# Patient Record
Sex: Female | Born: 1955 | Race: White | Hispanic: No | Smoking: Never smoker
Health system: Southern US, Community
[De-identification: ages and names within clinical notes are randomized; demographics above are authoritative.]

## PROBLEM LIST (undated history)

## (undated) DIAGNOSIS — G43909 Migraine, unspecified, not intractable, without status migrainosus: Secondary | ICD-10-CM

## (undated) DIAGNOSIS — R7303 Prediabetes: Secondary | ICD-10-CM

## (undated) DIAGNOSIS — M5430 Sciatica, unspecified side: Secondary | ICD-10-CM

## (undated) DIAGNOSIS — R569 Unspecified convulsions: Secondary | ICD-10-CM

## (undated) DIAGNOSIS — T7840XA Allergy, unspecified, initial encounter: Secondary | ICD-10-CM

## (undated) DIAGNOSIS — I10 Essential (primary) hypertension: Secondary | ICD-10-CM

## (undated) DIAGNOSIS — M48061 Spinal stenosis, lumbar region without neurogenic claudication: Secondary | ICD-10-CM

## (undated) DIAGNOSIS — F418 Other specified anxiety disorders: Secondary | ICD-10-CM

## (undated) DIAGNOSIS — M5440 Lumbago with sciatica, unspecified side: Secondary | ICD-10-CM

## (undated) DIAGNOSIS — G249 Dystonia, unspecified: Secondary | ICD-10-CM

## (undated) DIAGNOSIS — K259 Gastric ulcer, unspecified as acute or chronic, without hemorrhage or perforation: Secondary | ICD-10-CM

## (undated) HISTORY — PX: EYE SURGERY: SHX253

## (undated) HISTORY — DX: Sciatica, unspecified side: M54.30

## (undated) HISTORY — DX: Gastric ulcer, unspecified as acute or chronic, without hemorrhage or perforation: K25.9

## (undated) HISTORY — DX: Allergy, unspecified, initial encounter: T78.40XA

## (undated) HISTORY — PX: BREAST EXCISIONAL BIOPSY: SUR124

## (undated) HISTORY — DX: Other specified anxiety disorders: F41.8

## (undated) HISTORY — PX: OTHER SURGICAL HISTORY: SHX169

## (undated) HISTORY — DX: Unspecified convulsions: R56.9

## (undated) HISTORY — DX: Migraine, unspecified, not intractable, without status migrainosus: G43.909

## (undated) HISTORY — DX: Dystonia, unspecified: G24.9

---
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
## 1991-10-14 HISTORY — PX: BREAST BIOPSY: SHX20

## 2007-09-22 ENCOUNTER — Ambulatory Visit: Payer: Self-pay | Admitting: Neurology

## 2008-09-11 ENCOUNTER — Ambulatory Visit: Payer: Self-pay | Admitting: Unknown Physician Specialty

## 2008-09-14 ENCOUNTER — Ambulatory Visit: Payer: Self-pay | Admitting: Unknown Physician Specialty

## 2010-09-30 ENCOUNTER — Ambulatory Visit: Payer: Self-pay | Admitting: Unknown Physician Specialty

## 2013-03-21 ENCOUNTER — Other Ambulatory Visit: Payer: Self-pay

## 2013-03-21 NOTE — Telephone Encounter (Signed)
Forwarding to provider for approval.

## 2013-03-21 NOTE — Telephone Encounter (Signed)
Called pt. She is requesting Clonazepam refill. Only has 1 more for tomorrow.

## 2013-03-23 MED ORDER — CLONAZEPAM 0.5 MG PO TABS: 0.5000 mg | ORAL_TABLET | Freq: Two times a day (BID) | ORAL | Status: DC

## 2013-09-21 ENCOUNTER — Ambulatory Visit (INDEPENDENT_AMBULATORY_CARE_PROVIDER_SITE_OTHER): Payer: Commercial Managed Care - PPO | Admitting: Nurse Practitioner

## 2013-09-21 ENCOUNTER — Encounter: Payer: Self-pay | Admitting: Nurse Practitioner

## 2013-09-21 ENCOUNTER — Encounter (INDEPENDENT_AMBULATORY_CARE_PROVIDER_SITE_OTHER): Payer: Self-pay

## 2013-09-21 VITALS — BP 121/72 | HR 65 | Ht 66.25 in | Wt 233.0 lb

## 2013-09-21 DIAGNOSIS — R259 Unspecified abnormal involuntary movements: Secondary | ICD-10-CM

## 2013-09-21 MED ORDER — CLONAZEPAM 0.5 MG PO TABS: 0.5000 mg | ORAL_TABLET | Freq: Two times a day (BID) | ORAL | Status: DC

## 2013-09-21 NOTE — Progress Notes (Signed)
[facility]  PATIENT: Kiara Haas. Gerstenberger DOB: 1956-08-22   REASON FOR VISIT: follow up for tremor   HISTORY OF PRESENT ILLNESS: Kiara Haas, 57 year old female returns for followup. She was last seen 09/21/2012. 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 She denies any difficulty performing her job, writing or performing other tasks. She is on an antidepressant by her primary care. 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 When initially diagnosed her symptoms were variable, sometimes mild and sometimes interfering with her ability to write and perform  fine function movements She has a remote history of epilepsy, she has had no seizure activity. She denies any difficulty writing or performing other tasks now, she has had no problems with her job. Hx of  depression and been placed on any anti-depressant in recent years. No new neurologic complaints.   REVIEW OF SYSTEMS: Full 14 system review of systems performed and notable only for those listed, all others are neg:  Constitutional: N/A  Cardiovascular: N/A  Ear/Nose/Throat: N/A  Skin: N/A  Eyes: N/A  Respiratory: N/A  Gastroitestinal: N/A  Hematology/Lymphatic: N/A  Endocrine: N/A Musculoskeletal:N/A  Allergy/Immunology: N/A  Neurological: N/A Psychiatric: N/A   ALLERGIES: Allergies  Allergen Reactions  . Cephalexin Other (See Comments)    Blisters in the mouth  . Paxil [Paroxetine Hcl]     Blisters in mouth and over body    HOME MEDICATIONS: Outpatient Prescriptions Prior to Visit  Medication Sig Dispense Refill  . clonazePAM (KLONOPIN) 0.5 MG tablet Take 1 tablet (0.5 mg total) by mouth 2 (two) times daily.  60 tablet  5   No facility-administered medications prior to visit.    PAST MEDICAL HISTORY: Past Medical History   Diagnosis Date  . Depression with anxiety   . Migraine   . Dystonia     hand    PAST SURGICAL HISTORY: Past Surgical History  Procedure Laterality Date  . Eye surgery      times 2 age 66 and 45 lazy eye  . Polyps burned      FAMILY HISTORY: Family History  Problem Relation Age of Onset  . Breast cancer Sister   . Pancreatic cancer Maternal Aunt   . Pancreatic cancer Paternal Aunt   . Pancreatic cancer Paternal Grandmother   . Heart disease Brother   . Diabetes Mother   . Diabetes Sister   . Diabetes Brother   . COPD Mother     SOCIAL HISTORY: History   Social History  . Marital Status: Divorced    Spouse Name: N/A    Number of Children: 0  . Years of Education: 12+   Occupational History  . Not on file.   Social History Main Topics  . Smoking status: Never Smoker   . Smokeless tobacco: Never Used  . Alcohol Use: No  . Drug Use: No  . Sexual Activity: Not on file   Other Topics Concern  . Adm asst for Kiara Haas retirement   Social History Narrative   Patient lives at home alone with 2 cats.    Patient has no children.    Patient is single. /separated   Patient has 2 years of college.      PHYSICAL EXAM  Filed Vitals:   09/21/13 1449  BP: 121/72  Pulse: 65  Height:  5' 6.25" (1.683 m)  Weight: 233 lb (105.688 kg)   Body mass index is 37.31 kg/(m^2).  Generalized: Well developed, in no acute distress     Neurological examination   Mentation: Alert oriented to time, place, history taking. Follows all commands speech and language fluent  Cranial nerve II-XII: Pupils were equal round reactive to light extraocular movements were full, visual field were full on confrontational test. Facial sensation and strength were normal. hearing was intact to finger rubbing bilaterally. Uvula tongue midline. head turning and shoulder shrug were normal and symmetric.Tongue protrusion into cheek strength was normal. Motor: normal bulk and tone, full strength in  the BUE, BLE, fine finger movements normal, no pronator drift. No focal weakness Sensory: normal and symmetric to light touch, pinprick, and  vibration  Coordination: finger-nose-finger, heel-to-shin bilaterally, no dysmetria Reflexes: Brachioradialis 2/2, biceps 2/2, triceps 2/2, patellar 2/2, Achilles 2/2, plantar responses were flexor bilaterally. Gait and Station: Rising up from seated position without assistance, normal stance,  moderate stride, good arm swing, smooth turning, able to perform tiptoe, and heel walking without difficulty. Tandem gait is steady  DIAGNOSTIC DATA (LABS, IMAGING, TESTING) None to review   ASSESSMENT AND PLAN  57 y.o. year old female  has a past medical history of Depression with anxiety; Migraine; and Dystonia. here to followup.   Continue Clonazepam at current dose RX to pt F/U yearly Nilda Riggs, [facility], [facility], APRN  [facility] [address] [place], [place] [address] [phone]

## 2013-09-21 NOTE — Patient Instructions (Signed)
Continue Clonazepam at current dose RX to pt F/U yearly

## 2013-09-22 NOTE — Progress Notes (Signed)
I reviewed note and agree with plan.   Saida Lonon R. Omaree Fuqua, MD  Certified in Neurology, Neurophysiology and Neuroimaging  [facility] [address], [address] [place], [place] [address] [phone]   

## 2013-10-19 ENCOUNTER — Other Ambulatory Visit: Payer: Self-pay | Admitting: Nurse Practitioner

## 2014-03-15 ENCOUNTER — Other Ambulatory Visit: Payer: Self-pay | Admitting: Nurse Practitioner

## 2014-03-16 ENCOUNTER — Other Ambulatory Visit: Payer: Self-pay

## 2014-03-16 MED ORDER — CLONAZEPAM 0.5 MG PO TABS: 0.5000 mg | ORAL_TABLET | Freq: Two times a day (BID) | ORAL | Status: DC

## 2014-03-16 NOTE — Telephone Encounter (Signed)
Rx signed and faxed.

## 2014-03-16 NOTE — Telephone Encounter (Signed)
Dr Penumalli is out of the office.  Forwarding request to [facility] for approval  

## 2014-09-12 LAB — HM MAMMOGRAPHY

## 2014-09-21 ENCOUNTER — Encounter: Payer: Self-pay | Admitting: Nurse Practitioner

## 2014-09-21 ENCOUNTER — Ambulatory Visit (INDEPENDENT_AMBULATORY_CARE_PROVIDER_SITE_OTHER): Payer: Commercial Managed Care - PPO | Admitting: Nurse Practitioner

## 2014-09-21 VITALS — BP 130/79 | HR 61 | Ht 67.25 in | Wt 239.0 lb

## 2014-09-21 DIAGNOSIS — R259 Unspecified abnormal involuntary movements: Secondary | ICD-10-CM

## 2014-09-21 DIAGNOSIS — G249 Dystonia, unspecified: Secondary | ICD-10-CM | POA: Insufficient documentation

## 2014-09-21 MED ORDER — CLONAZEPAM 0.5 MG PO TABS: 0.5000 mg | ORAL_TABLET | Freq: Two times a day (BID) | ORAL | Status: DC

## 2014-09-21 NOTE — Progress Notes (Signed)
[facility]  PATIENT: Kiara Haas DOB: [birth year]   REASON FOR VISIT: Follow-up for dystonia of the right upper extremity   HISTORY OF PRESENT ILLNESS:Kiara Haas, 58 year old female returns for followup. She was last seen 09/21/2013. 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 She denies any difficulty performing her job, writing or performing other tasks. She is on an antidepressant by her primary care. She has no new neurologic complaints. She returns for reevaluation. She needs refills on medication.   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 When initially diagnosed her symptoms were variable, sometimes mild and sometimes interfering with her ability to write and perform fine function movements She has a remote history of epilepsy, she has had no seizure activity. She denies any difficulty writing or performing other tasks now, she has had no problems with her job. Hx of depression and been placed on any anti-depressant in recent years. No new neurologic complaints.   REVIEW OF SYSTEMS: Full 14 system review of systems performed and notable only for those listed, all others are neg:  Constitutional: N/A  Cardiovascular: N/A  Ear/Nose/Throat: N/A  Skin: N/A  Eyes: N/A  Respiratory: N/A  Gastroitestinal: N/A  Hematology/Lymphatic: N/A  Endocrine: N/A Musculoskeletal:N/A  Allergy/Immunology: N/A  Neurological: N/A Psychiatric: N/A Sleep : NA   ALLERGIES: Allergies  Allergen Reactions  . Cephalexin Other (See Comments)    Blisters in the mouth  . Paxil [Paroxetine Hcl]     Blisters in mouth and over body    HOME MEDICATIONS: Outpatient Prescriptions Prior to Visit  Medication Sig Dispense Refill  . Calcium Carbonate (CALTRATE 600 PO) Take 600 mg by mouth daily.    . clonazePAM (KLONOPIN) 0.5 MG tablet Take  1 tablet (0.5 mg total) by mouth 2 (two) times daily. 60 tablet 5  . sertraline (ZOLOFT) 50 MG tablet Take 50 mg by mouth daily.     No facility-administered medications prior to visit.    PAST MEDICAL HISTORY: Past Medical History  Diagnosis Date  . Depression with anxiety   . Migraine   . Dystonia     hand    PAST SURGICAL HISTORY: Past Surgical History  Procedure Laterality Date  . Eye surgery      times 2 age 89 and [age] lazy eye  . Polyps burned      FAMILY HISTORY: Family History  Problem Relation Age of Onset  . Breast cancer Sister   . Pancreatic cancer Maternal Aunt   . Pancreatic cancer Paternal Aunt   . Pancreatic cancer Paternal Grandmother   . Heart disease Brother   . Diabetes Mother   . Diabetes Sister   . Diabetes Brother   . COPD Mother     SOCIAL HISTORY: History   Social History  . Marital Status: Divorced    Spouse Name: N/A    Number of Children: 0  . Years of Education: 12+   Occupational History  . Not on file.   Social History Main Topics  . Smoking status: Never Smoker   . Smokeless tobacco: Never Used  . Alcohol Use: No  . Drug Use: No  . Sexual Activity: Not on file   Other Topics Concern  . Not on file   Social History Narrative   Patient lives at home alone with 2 cats.    Patient has no  children.    Patient is single.    Patient has 2 years of college.      PHYSICAL EXAM  Filed Vitals:   09/21/14 1446  BP: 130/79  Pulse: 61  Height: 5' 7.25" (1.708 m)  Weight: 239 lb (108.41 kg)   Body mass index is 37.16 kg/(m^2). Generalized: Well developed, in no acute distress    Neurological examination   Mentation: Alert oriented to time, place, history taking. Follows all commands speech and language fluent  Cranial nerve II-XII: Pupils were equal round reactive to light extraocular movements were full, visual field were full on confrontational test. Facial sensation and strength were normal. hearing was intact to  finger rubbing bilaterally. Uvula tongue midline. head turning and shoulder shrug were normal and symmetric.Tongue protrusion into cheek strength was normal. Motor: normal bulk and tone, full strength in the BUE, BLE, fine finger movements normal, no pronator drift. No focal weakness Coordination: finger-nose-finger, heel-to-shin bilaterally, no dysmetria Reflexes: Brachioradialis 2/2, biceps 2/2, triceps 2/2, patellar 2/2, Achilles 2/2, plantar responses were flexor bilaterally. Gait and Station: Rising up from seated position without assistance, normal stance, moderate stride, good arm swing, smooth turning, able to perform tiptoe, and heel walking without difficulty. Tandem gait is steady   DIAGNOSTIC DATA (LABS, IMAGING, TESTING) ASSESSMENT AND PLAN  58 y.o. year old female  has a past medical history of Depression with anxiety; Migraine; and Dystonia. here to follow-up for her right upper extremity dystonia.  Continue clonazepam at current dose Follow-up yearly and when necessary Kiara Haas, [facility], [facility], APRN  [facility] [address], [address] [place], [place] [address] [phone]

## 2014-09-21 NOTE — Patient Instructions (Signed)
Continue clonazepam at current dose  Follow-up yearly and when necessary  

## 2014-09-21 NOTE — Progress Notes (Signed)
I reviewed note and agree with plan.   Suanne MarkerVIKRAM R. PENUMALLI, MD 09/21/2014, 6:40 PM Certified in Neurology, Neurophysiology and Neuroimaging  [facility] [address] [place], [place] [address] [phone]

## 2014-10-02 LAB — HM MAMMOGRAPHY

## 2014-10-26 ENCOUNTER — Emergency Department: Payer: Self-pay | Admitting: Emergency Medicine

## 2014-12-05 ENCOUNTER — Encounter: Payer: Self-pay | Admitting: Family Medicine

## 2014-12-05 ENCOUNTER — Ambulatory Visit (INDEPENDENT_AMBULATORY_CARE_PROVIDER_SITE_OTHER): Payer: Commercial Managed Care - PPO | Admitting: Family Medicine

## 2014-12-05 VITALS — BP 132/78 | HR 60 | Temp 97.9°F | Ht 66.0 in | Wt 237.5 lb

## 2014-12-05 DIAGNOSIS — R259 Unspecified abnormal involuntary movements: Secondary | ICD-10-CM

## 2014-12-05 DIAGNOSIS — F329 Major depressive disorder, single episode, unspecified: Secondary | ICD-10-CM

## 2014-12-05 DIAGNOSIS — F419 Anxiety disorder, unspecified: Secondary | ICD-10-CM | POA: Insufficient documentation

## 2014-12-05 DIAGNOSIS — F4321 Adjustment disorder with depressed mood: Secondary | ICD-10-CM

## 2014-12-05 DIAGNOSIS — F32A Depression, unspecified: Secondary | ICD-10-CM | POA: Insufficient documentation

## 2014-12-05 DIAGNOSIS — F418 Other specified anxiety disorders: Secondary | ICD-10-CM

## 2014-12-05 DIAGNOSIS — M5431 Sciatica, right side: Secondary | ICD-10-CM | POA: Insufficient documentation

## 2014-12-05 DIAGNOSIS — G249 Dystonia, unspecified: Secondary | ICD-10-CM

## 2014-12-05 NOTE — Progress Notes (Signed)
Subjective:   Patient ID: Kiara Haas, female    DOB: [birth year], 59 y.o.   MRN: [id number]  Kiara Haas is a pleasant 59 y.o. year old female who presents to clinic today with Establish Care  on 12/05/2014  HPI:  Depression/anxiety- has been having a tough time losing her mom a little over a year ago.  Attended grief counseling for a year.  Feels zoloft at current dose is helping quite a bit.  Takes klonipin but this is for her dystonia, followed by neuro- see note in Epic from 09/21/14.  Recent bout of sciatica- seeing Dr. Ernest Pine.  Feels symptoms are improving.  Wants to start exercising more soon.  Mammogram, pap in December 2015. Per pt, colonoscopy 2010 at [facility].  Current Outpatient Prescriptions on File Prior to Visit  Medication Sig Dispense Refill  . albuterol (PROAIR HFA) 108 (90 BASE) MCG/ACT inhaler Inhale into the lungs.    . Calcium Carbonate (CALTRATE 600 PO) Take 600 mg by mouth daily.    . clonazePAM (KLONOPIN) 0.5 MG tablet Take 1 tablet (0.5 mg total) by mouth 2 (two) times daily. 60 tablet 5  . sertraline (ZOLOFT) 50 MG tablet Take 50 mg by mouth daily.     No current facility-administered medications on file prior to visit.    Allergies  Allergen Reactions  . Cephalexin Other (See Comments)    Blisters in the mouth  . Paxil [Paroxetine Hcl]     Blisters in mouth and over body  . Cefuroxime Axetil Swelling    Past Medical History  Diagnosis Date  . Depression with anxiety   . Migraine   . Dystonia     hand  . Multiple gastric ulcers   . Allergy   . Seizures     Past Surgical History  Procedure Laterality Date  . Eye surgery      times 2 age 69 and 22 lazy eye  . Polyps burned    . Tonsils  1963  . Breast biopsy  1993    Family History  Problem Relation Age of Onset  . Breast cancer Sister   . Pancreatic cancer Maternal Aunt   . Pancreatic cancer Paternal Aunt   . Pancreatic cancer Paternal Grandmother   . Heart disease Brother   .  Diabetes Mother   . COPD Mother   . Arthritis Mother   . Diabetes Sister   . Diabetes Brother   . Arthritis Father     History   Social History  . Marital Status: Married    Spouse Name: N/A  . Number of Children: 0  . Years of Education: 12+   Occupational History  . Not on file.   Social History Main Topics  . Smoking status: Never Smoker   . Smokeless tobacco: Never Used  . Alcohol Use: No  . Drug Use: No  . Sexual Activity: No   Other Topics Concern  . Not on file   Social History Narrative   Patient lives at home alone with 2 cats.    Patient has no children.    Patient is single.    Patient has 2 years of college.    The PMH, PSH, Social History, Family History, Medications, and allergies have been reviewed in [facility], and have been updated if relevant.   Review of Systems  Constitutional: Negative.   HENT: Negative.   Eyes: Negative.   Respiratory: Negative.   Cardiovascular: Negative.   Gastrointestinal: Negative.   Endocrine:  Negative.   Genitourinary: Negative.   Musculoskeletal: Positive for back pain. Negative for gait problem.  Skin: Negative.   Allergic/Immunologic: Negative.   Neurological: Negative.   Hematological: Negative.   Psychiatric/Behavioral: Positive for sleep disturbance, dysphoric mood and decreased concentration. The patient is nervous/anxious.   All other systems reviewed and are negative.      Objective:    BP 132/78 mmHg  Pulse 60  Temp(Src) 97.9 F (36.6 C) (Oral)  Ht 5\' 6"  (1.676 m)  Wt 237 lb 8 oz (107.729 kg)  BMI 38.35 kg/m2  SpO2 96%   Physical Exam  Constitutional: She is oriented to person, place, and time. She appears well-developed and well-nourished. No distress.  HENT:  Head: Normocephalic.  Eyes: Conjunctivae are normal.  Neck: Normal range of motion.  Cardiovascular: Normal rate and regular rhythm.   Pulmonary/Chest: Effort normal and breath sounds normal. No respiratory distress.  Musculoskeletal:  Normal range of motion.  Neurological: She is alert and oriented to person, place, and time. No cranial nerve deficit.  Skin: Skin is warm and dry.  Psychiatric: Thought content normal. Her mood appears not anxious. Her affect is not labile and not inappropriate. Her speech is tangential.  Nursing note and vitals reviewed.         Assessment & Plan:   Sciatica of right side  Dystonia  Abnormal involuntary movement  Anxiety and depression No Follow-up on file.

## 2014-12-05 NOTE — Patient Instructions (Signed)
Great to see you. Hang in there.  I know Kiara Haas will be hard but try to enjoy it the way your mom would want you to.

## 2014-12-05 NOTE — Assessment & Plan Note (Signed)
>  30 minutes spent in face to face time with patient, >50% spent in counselling or coordination of care Deteriorated with grief from the loss of her mother.  Spent a lot of time today talking about her mom since I was her mom's doctor, which was appropriate but her speech was tangential. Continue psychotherapy and zoloft at current dose. The patient indicates understanding of these issues and agrees with the plan.

## 2014-12-05 NOTE — Assessment & Plan Note (Signed)
Followed by neuro- only involves her hands.  Feels klonipin is working well.

## 2014-12-05 NOTE — Progress Notes (Signed)
Pre visit review using our clinic review tool, if applicable. No additional management support is needed unless otherwise documented below in the visit note. 

## 2015-03-21 ENCOUNTER — Other Ambulatory Visit: Payer: Self-pay | Admitting: Nurse Practitioner

## 2015-03-21 NOTE — Telephone Encounter (Signed)
Patient last seen in Dec, has annual appt scheduled this Dec.

## 2015-03-21 NOTE — Telephone Encounter (Signed)
Rx signed and faxed.

## 2015-05-18 ENCOUNTER — Encounter: Payer: Self-pay | Admitting: *Deleted

## 2015-07-09 ENCOUNTER — Encounter: Payer: Self-pay | Admitting: Internal Medicine

## 2015-07-09 ENCOUNTER — Ambulatory Visit (INDEPENDENT_AMBULATORY_CARE_PROVIDER_SITE_OTHER): Payer: Commercial Managed Care - PPO | Admitting: Internal Medicine

## 2015-07-09 VITALS — BP 142/82 | HR 63 | Temp 98.2°F | Wt 240.2 lb

## 2015-07-09 DIAGNOSIS — L255 Unspecified contact dermatitis due to plants, except food: Secondary | ICD-10-CM

## 2015-07-09 MED ORDER — METHYLPREDNISOLONE ACETATE 40 MG/ML IJ SUSP
80.0000 mg | Freq: Once | INTRAMUSCULAR | Status: AC
  Administered 2015-07-09: 80 mg via INTRAMUSCULAR

## 2015-07-09 MED ORDER — PREDNISONE 10 MG PO TABS: ORAL_TABLET | ORAL | Status: DC

## 2015-07-09 NOTE — Patient Instructions (Signed)

## 2015-07-09 NOTE — Progress Notes (Signed)
Subjective:    Patient ID: Kiara Haas, female    DOB: 07-Aug-1956, 59 y.o.   MRN: [id number]  HPI  Pt presents to the clinic today with c/o a rash. She first noticed it this weekend after raking sticks and leaves. It started on her arms and has spread to her neck, upper chest and legs. She has noticed some blisters, but nothing has opened up or drained. The rash is very itchy. She thinks it may be poison ivy. She denies changes in soaps, lotions or detergents. She has put [employer] on it without relief.  Review of Systems      Past Medical History  Diagnosis Date  . Depression with anxiety   . Migraine   . Dystonia     hand  . Multiple gastric ulcers   . Allergy   . Seizures     Current Outpatient Prescriptions  Medication Sig Dispense Refill  . albuterol (PROAIR HFA) 108 (90 BASE) MCG/ACT inhaler Inhale into the lungs.    . Calcium Carbonate (CALTRATE 600 PO) Take 600 mg by mouth daily.    . clonazePAM (KLONOPIN) 0.5 MG tablet TAKE 1 TABLET BY MOUTH TWICE DAILY 60 tablet 5  . sertraline (ZOLOFT) 50 MG tablet Take 50 mg by mouth daily.    . predniSONE (DELTASONE) 10 MG tablet Take 3 tabs on days 1-3, take 2 tabs on days 4-6, take 1 tab on days 7-9 18 tablet 0   No current facility-administered medications for this visit.    Allergies  Allergen Reactions  . Cephalexin Other (See Comments)    Blisters in the mouth  . Paxil [Paroxetine Hcl]     Blisters in mouth and over body  . Cefuroxime Axetil Swelling    Family History  Problem Relation Age of Onset  . Breast cancer Sister   . Pancreatic cancer Maternal Aunt   . Pancreatic cancer Paternal Aunt   . Pancreatic cancer Paternal Grandmother   . Heart disease Brother   . Diabetes Mother   . COPD Mother   . Arthritis Mother   . Diabetes Sister   . Diabetes Brother   . Arthritis Father     Social History   Social History  . Marital Status: Married    Spouse Name: N/A  . Number of Children: 0  . Years  of Education: 12+   Occupational History  . Not on file.   Social History Main Topics  . Smoking status: Never Smoker   . Smokeless tobacco: Never Used  . Alcohol Use: No  . Drug Use: No  . Sexual Activity: No   Other Topics Concern  . Not on file   Social History Narrative   Patient lives at home alone with 2 cats.    Patient has no children.    Patient is single.    Patient has 2 years of college.      Constitutional: Denies fever, malaise, fatigue, headache or abrupt weight changes.  Respiratory: Denies difficulty breathing, shortness of breath, cough or sputum production.   Cardiovascular: Denies chest pain, chest tightness, palpitations or swelling in the hands or feet.  Skin: Pt reports rash. Denies ulcercations.   No other specific complaints in a complete review of systems (except as listed in HPI above).  Objective:   Physical Exam   BP 142/82 mmHg  Pulse 63  Temp(Src) 98.2 F (36.8 C) (Oral)  Wt 240 lb 4 oz (108.977 kg)  SpO2 96% Wt Readings from  Last 3 Encounters:  07/09/15 240 lb 4 oz (108.977 kg)  12/05/14 237 lb 8 oz (107.729 kg)  09/21/14 239 lb (108.41 kg)    General: Appears her stated age, in NAD. Skin: Scattered, liner pustular lesions on erythematous base noted on arms, neck, upper chest and legs. Cardiovascular: Normal rate and rhythm. S1,S2 noted.  No murmur, rubs or gallops noted.  Pulmonary/Chest: Normal effort and positive vesicular breath sounds. No respiratory distress. No wheezes, rales or ronchi noted.       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

## 2015-07-09 NOTE — Progress Notes (Signed)
Pre visit review using our clinic review tool, if applicable. No additional management support is needed unless otherwise documented below in the visit note. 

## 2015-09-10 ENCOUNTER — Other Ambulatory Visit: Payer: Self-pay

## 2015-09-10 MED ORDER — SERTRALINE HCL 50 MG PO TABS
50.0000 mg | ORAL_TABLET | Freq: Every day | ORAL | Status: DC
Start: 2015-09-10 — End: 2015-12-10

## 2015-09-10 NOTE — Telephone Encounter (Signed)
Pt left v/m request refill sertraline; pt established care on 12/05/14 and discussed with Dr Dayton MartesAron about taking over refills of sertraline; pt will be out of sertraline on 09/13/15.Dr Dayton MartesAron has never prescribed before and pt wants to know if needs appt prior to refills.Please advise.

## 2015-09-24 ENCOUNTER — Encounter: Payer: Self-pay | Admitting: Nurse Practitioner

## 2015-09-24 ENCOUNTER — Ambulatory Visit (INDEPENDENT_AMBULATORY_CARE_PROVIDER_SITE_OTHER): Payer: Commercial Managed Care - PPO | Admitting: Nurse Practitioner

## 2015-09-24 VITALS — BP 135/73 | HR 71 | Ht 66.0 in | Wt 240.0 lb

## 2015-09-24 DIAGNOSIS — R259 Unspecified abnormal involuntary movements: Secondary | ICD-10-CM | POA: Diagnosis not present

## 2015-09-24 MED ORDER — CLONAZEPAM 0.5 MG PO TABS: 0.5000 mg | ORAL_TABLET | Freq: Two times a day (BID) | ORAL | Status: DC

## 2015-09-24 NOTE — Progress Notes (Signed)
[facility]  PATIENT: Kiara Haas DOB: 09/13/1956   REASON FOR VISIT: Follow-up for abnormal involuntary movements, dystonia of the right upper extremity HISTORY FROM: Patient    HISTORY OF PRESENT ILLNESS:Kiara Haas, 59 year old female returns for followup. She was last seen 09/21/2014. 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 She denies any difficulty performing her job, writing or performing other tasks. She is on an antidepressant by her primary care. She has no new neurologic complaints. She returns for reevaluation. She needs refills on medication.   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 When initially diagnosed her symptoms were variable, sometimes mild and sometimes interfering with her ability to write and perform fine function movements She has a remote history of epilepsy, she has had no seizure activity. She denies any difficulty writing or performing other tasks now, she has had no problems with her job. Hx of depression and been placed on any anti-depressant in recent years. No new neurologic complaints.    REVIEW OF SYSTEMS: Full 14 system review of systems performed and notable only for those listed, all others are neg:  Constitutional: neg  Cardiovascular: neg Ear/Nose/Throat: neg  Skin: neg Eyes: neg Respiratory: neg Gastroitestinal: neg  Hematology/Lymphatic: neg  Endocrine: neg Musculoskeletal:neg Allergy/Immunology: neg Neurological: neg Psychiatric: Depression Sleep : neg   ALLERGIES: Allergies  Allergen Reactions  . Cephalexin Other (See Comments)    Blisters in the mouth  . Paxil [Paroxetine Hcl]     Blisters in mouth and over body  . Cefuroxime Axetil Swelling    HOME MEDICATIONS: Outpatient Prescriptions Prior to Visit  Medication Sig Dispense Refill  . albuterol (PROAIR  HFA) 108 (90 BASE) MCG/ACT inhaler Inhale into the lungs as needed.     . Calcium Carbonate (CALTRATE 600 PO) Take 600 mg by mouth daily.    . clonazePAM (KLONOPIN) 0.5 MG tablet TAKE 1 TABLET BY MOUTH TWICE DAILY 60 tablet 5  . predniSONE (DELTASONE) 10 MG tablet Take 3 tabs on days 1-3, take 2 tabs on days 4-6, take 1 tab on days 7-9 (Patient taking differently: as needed. Take 3 tabs on days 1-3, take 2 tabs on days 4-6, take 1 tab on days 7-9) 18 tablet 0  . sertraline (ZOLOFT) 50 MG tablet Take 1 tablet (50 mg total) by mouth daily. 30 tablet 2   No facility-administered medications prior to visit.    PAST MEDICAL HISTORY: Past Medical History  Diagnosis Date  . Depression with anxiety   . Migraine   . Dystonia     hand  . Multiple gastric ulcers   . Allergy   . Seizures (HCC)   . Sciatica     bilateral    PAST SURGICAL HISTORY: Past Surgical History  Procedure Laterality Date  . Eye surgery      times 2 age 29 and 46 lazy eye  . Polyps burned    . Tonsils  1963  . Breast biopsy  1993    FAMILY HISTORY: Family History  Problem Relation Age of Onset  . Breast cancer Sister   . Pancreatic cancer Maternal Aunt   . Pancreatic cancer Paternal Aunt   . Pancreatic cancer Paternal Grandmother   . Heart disease Brother   . Diabetes Mother   . COPD Mother   . Arthritis Mother   . Diabetes Sister   .  Diabetes Brother   . Arthritis Father     SOCIAL HISTORY: Social History   Social History  . Marital Status: Married    Spouse Name: N/A  . Number of Children: 0  . Years of Education: 12+   Occupational History  . Not on file.   Social History Main Topics  . Smoking status: Never Smoker   . Smokeless tobacco: Never Used  . Alcohol Use: No  . Drug Use: No  . Sexual Activity: No   Other Topics Concern  . Not on file   Social History Narrative   Patient lives at home alone with 2 cats.    Patient has no children.    Patient is single.    Patient has 2  years of college.      PHYSICAL EXAM  Filed Vitals:   09/24/15 1445  BP: 135/73  Pulse: 71  Height: 5\' 6"  (1.676 m)  Weight: 240 lb (108.863 kg)   Body mass index is 38.76 kg/(m^2). Generalized: Well developed, in no acute distress   Neurological examination   Mentation: Alert oriented to time, place, history taking. Follows all commands speech and language fluent Cranial nerve II-XII: Pupils were equal round reactive to light extraocular movements were full, visual field were full on confrontational test. Facial sensation and strength were normal. hearing was intact to finger rubbing bilaterally. Uvula tongue midline. head turning and shoulder shrug were normal and symmetric.Tongue protrusion into cheek strength was normal. Motor: normal bulk and tone, full strength in the BUE, BLE, fine finger movements normal, no pronator drift. No focal weakness Coordination: finger-nose-finger, heel-to-shin bilaterally, no dysmetria Reflexes: Brachioradialis 2/2, biceps 2/2, triceps 2/2, patellar 2/2, Achilles 2/2, plantar responses were flexor bilaterally. Gait and Station: Rising up from seated position without assistance, normal stance, moderate stride, good arm swing, smooth turning, able to perform tiptoe, and heel walking without difficulty. Tandem gait is steady   DIAGNOSTIC DATA (LABS, IMAGING, TESTING) - ASSESSMENT AND PLAN 59 y.o. year old female  has a past medical history of Depression with anxiety; Migraine; and Dystonia. here to follow-up for her right upper extremity dystonia.  Continue clonazepam at current dose RX to patient Follow-up yearly and when necessary  Nilda RiggsNancy Carolyn Martin, [facility] Center[facility]NP, [facility] HospitalBC, APRN  [facility] [address], [address] [place], [place] [id number] [phone] He is well. Charts documenting all

## 2015-09-24 NOTE — Patient Instructions (Signed)
Continue clonazepam at current dose  Follow-up yearly and when necessary  

## 2015-10-02 NOTE — Progress Notes (Signed)
I reviewed note and agree with plan.   VIKRAM R. PENUMALLI, MD 10/02/2015, 12:14 PM Certified in Neurology, Neurophysiology and Neuroimaging  [facility] [address], [address] [place], [place] [address] [phone]  

## 2015-10-11 ENCOUNTER — Ambulatory Visit (INDEPENDENT_AMBULATORY_CARE_PROVIDER_SITE_OTHER): Payer: Commercial Managed Care - PPO | Admitting: Family Medicine

## 2015-10-11 VITALS — BP 110/64 | HR 63 | Temp 98.1°F | Wt 240.5 lb

## 2015-10-11 DIAGNOSIS — M5431 Sciatica, right side: Secondary | ICD-10-CM

## 2015-10-11 MED ORDER — PREDNISONE 20 MG PO TABS: ORAL_TABLET | ORAL | Status: DC

## 2015-10-11 NOTE — Progress Notes (Signed)
Subjective:   Patient ID: Kiara FergusonSusan S Haas, female    DOB: October 17, 1955, 59 y.o.   MRN: [id number]  Kiara Haas is a pleasant 59 y.o. year old female who presents to clinic today with Back Pain  on 10/11/2015  HPI:   Recent bout of sciatica- seeing Dr. Ernest PineHooten.  This is 3rd exacerbation in past 6 months.  Taking flexeril which typically helps but this time it is not helping.  Tried to get in with Dr. Ernest PineHooten but he was booked for several weeks.  She thinks she caused this episode by lifting very heavy groceries in the rain.  No urinary symptoms.  No LE weakness.  She just restarted her PT home exercises but pain has been stopping her from doing much of them.    Current Outpatient Prescriptions on File Prior to Visit  Medication Sig Dispense Refill  . albuterol (PROAIR HFA) 108 (90 BASE) MCG/ACT inhaler Inhale into the lungs as needed.     . Calcium Carbonate (CALTRATE 600 PO) Take 600 mg by mouth daily.    . clonazePAM (KLONOPIN) 0.5 MG tablet Take 1 tablet (0.5 mg total) by mouth 2 (two) times daily. 60 tablet 5  . cyclobenzaprine (FLEXERIL) 10 MG tablet Take by mouth.    . sertraline (ZOLOFT) 50 MG tablet Take 1 tablet (50 mg total) by mouth daily. 30 tablet 2   No current facility-administered medications on file prior to visit.    Allergies  Allergen Reactions  . Cephalexin Other (See Comments)    Blisters in the mouth  . Paxil [Paroxetine Hcl]     Blisters in mouth and over body  . Cefuroxime Axetil Swelling    Past Medical History  Diagnosis Date  . Depression with anxiety   . Migraine   . Dystonia     hand  . Multiple gastric ulcers   . Allergy   . Seizures (HCC)   . Sciatica     bilateral    Past Surgical History  Procedure Laterality Date  . Eye surgery      times 2 age 89 and [age] lazy eye  . Polyps burned    . Tonsils  1963  . Breast biopsy  1993    Family History  Problem Relation Age of Onset  . Breast cancer Sister   . Pancreatic cancer  Maternal Aunt   . Pancreatic cancer Paternal Aunt   . Pancreatic cancer Paternal Grandmother   . Heart disease Brother   . Diabetes Mother   . COPD Mother   . Arthritis Mother   . Diabetes Sister   . Diabetes Brother   . Arthritis Father     Social History   Social History  . Marital Status: Married    Spouse Name: N/A  . Number of Children: 0  . Years of Education: 12+   Occupational History  . Not on file.   Social History Main Topics  . Smoking status: Never Smoker   . Smokeless tobacco: Never Used  . Alcohol Use: No  . Drug Use: No  . Sexual Activity: No   Other Topics Concern  . Not on file   Social History Narrative   Patient lives at home alone with 2 cats.    Patient has no children.    Patient is single.    Patient has 2 years of college.    The PMH, PSH, Social History, Family History, Medications, and allergies [facility], and have been  updated if relevant.   Review of Systems  Constitutional: Negative.   HENT: Negative.   Eyes: Negative.   Respiratory: Negative.   Cardiovascular: Negative.   Gastrointestinal: Negative.   Endocrine: Negative.   Genitourinary: Negative.   Musculoskeletal: Positive for back pain. Negative for gait problem.  Skin: Negative.   Allergic/Immunologic: Negative.   Neurological: Negative.   Hematological: Negative.   All other systems reviewed and are negative.      Objective:    BP 110/64 mmHg  Pulse 63  Temp(Src) 98.1 F (36.7 C) (Oral)  Wt 240 lb 8 oz (109.09 kg)  SpO2 96%   Physical Exam  Constitutional: She is oriented to person, place, and time. She appears well-developed and well-nourished. No distress.  HENT:  Head: Normocephalic.  Eyes: Conjunctivae are normal.  Neck: Normal range of motion.  Cardiovascular: Normal rate and regular rhythm.   Pulmonary/Chest: Effort normal and breath sounds normal. No respiratory distress.  Musculoskeletal: Normal range of motion.       Lumbar  back: She exhibits tenderness and spasm.  SLR negative bilaterally  Neurological: She is alert and oriented to person, place, and time. No cranial nerve deficit.  Skin: Skin is warm and dry.  Psychiatric: Thought content normal. Her mood appears not anxious. Her affect is not labile and not inappropriate. Her speech is tangential.  Nursing note and vitals reviewed.         Assessment & Plan:   Sciatica of right side No Follow-up on file.

## 2015-10-11 NOTE — Patient Instructions (Signed)
Happy birthday and Happy New Year! Take prednisone as directed - in the morning and with food.  Please keep me updated.

## 2015-10-11 NOTE — Progress Notes (Signed)
Pre visit review using our clinic review tool, if applicable. No additional management support is needed unless otherwise documented below in the visit note. 

## 2015-10-11 NOTE — Assessment & Plan Note (Signed)
New- Treat with prednisone burst- continue exercises. Follow up with Dr. Ernest PineHooten. The patient indicates understanding of these issues and agrees with the plan.

## 2015-11-26 ENCOUNTER — Ambulatory Visit: Payer: Commercial Managed Care - PPO | Admitting: Family Medicine

## 2015-12-10 ENCOUNTER — Other Ambulatory Visit: Payer: Self-pay | Admitting: Family Medicine

## 2016-01-02 ENCOUNTER — Other Ambulatory Visit: Payer: Self-pay

## 2016-01-02 MED ORDER — SERTRALINE HCL 50 MG PO TABS: 50.0000 mg | ORAL_TABLET | Freq: Every day | ORAL | Status: DC

## 2016-01-02 NOTE — Telephone Encounter (Signed)
Ok to refill as requested 

## 2016-01-02 NOTE — Telephone Encounter (Signed)
Pt request refill for sertraline to [facility]; pt said the 10/11/15 visit sertraline refill was discussed and pt does not think needs to schedule another appt. In reviewing 10/11/2015 office note do not see notation of discussion of sertraline; pt was seen for back pain; 12/05/14 office note to establish noted discussion of sertraline but pt wants more refills than I could give from the 12/05/14 visit. Pt request cb.

## 2016-03-18 ENCOUNTER — Other Ambulatory Visit: Payer: Self-pay | Admitting: Family Medicine

## 2016-03-18 ENCOUNTER — Other Ambulatory Visit: Payer: Self-pay | Admitting: Nurse Practitioner

## 2016-03-19 NOTE — Telephone Encounter (Signed)
Lm on pts vm and advised OV is required. Rx filled for #30. Pt has not had any f/u regarding depression and anxiety.

## 2016-03-24 ENCOUNTER — Ambulatory Visit (INDEPENDENT_AMBULATORY_CARE_PROVIDER_SITE_OTHER): Payer: Commercial Managed Care - PPO | Admitting: Internal Medicine

## 2016-03-24 ENCOUNTER — Encounter: Payer: Self-pay | Admitting: Internal Medicine

## 2016-03-24 VITALS — BP 136/88 | HR 53 | Temp 97.8°F | Wt 234.8 lb

## 2016-03-24 DIAGNOSIS — F419 Anxiety disorder, unspecified: Secondary | ICD-10-CM

## 2016-03-24 DIAGNOSIS — F418 Other specified anxiety disorders: Secondary | ICD-10-CM

## 2016-03-24 DIAGNOSIS — F32A Depression, unspecified: Secondary | ICD-10-CM

## 2016-03-24 DIAGNOSIS — F329 Major depressive disorder, single episode, unspecified: Secondary | ICD-10-CM

## 2016-03-24 MED ORDER — SERTRALINE HCL 50 MG PO TABS: ORAL_TABLET | ORAL | Status: DC

## 2016-03-24 NOTE — Progress Notes (Signed)
Pre visit review using our clinic review tool, if applicable. No additional management support is needed unless otherwise documented below in the visit note. 

## 2016-03-24 NOTE — Progress Notes (Signed)
Subjective:    Patient ID: Kiara Haas, female    DOB: 1956-10-07, 60 y.o.   MRN: [id number]  HPI  Pt presents to the clinic today to follow up anxiety and depression. She reports this has gotten worse in the last few weeks after she got demoted from her job. She has been in her new position for 4 days and she reports she is having trouble adjusting. She is struggling. She is applying for new jobs. She is taking Zoloft as prescribed. She takes Clonazepam twice daily for dystonia in her upper extremity.  Review of Systems  Past Medical History  Diagnosis Date  . Depression with anxiety   . Migraine   . Dystonia     hand  . Multiple gastric ulcers   . Allergy   . Seizures (HCC)   . Sciatica     bilateral    Current Outpatient Prescriptions  Medication Sig Dispense Refill  . Calcium Carbonate (CALTRATE 600 PO) Take 600 mg by mouth daily.    . clonazePAM (KLONOPIN) 0.5 MG tablet TAKE 1 TABLET BY MOUTH TWICE DAILY 60 tablet 5  . cyclobenzaprine (FLEXERIL) 10 MG tablet Take 10 mg by mouth as needed.     . sertraline (ZOLOFT) 50 MG tablet TAKE 1 TABLET BY MOUTH DAILY. OFFICE VISIT REQUIRED FOR ADDITIONAL REFILLS. 30 tablet 0   No current facility-administered medications for this visit.    Allergies  Allergen Reactions  . Cephalexin Other (See Comments)    Blisters in the mouth  . Paxil [Paroxetine Hcl]     Blisters in mouth and over body  . Cefuroxime Axetil Swelling    Family History  Problem Relation Age of Onset  . Breast cancer Sister   . Pancreatic cancer Maternal Aunt   . Pancreatic cancer Paternal Aunt   . Pancreatic cancer Paternal Grandmother   . Heart disease Brother   . Diabetes Mother   . COPD Mother   . Arthritis Mother   . Diabetes Sister   . Diabetes Brother   . Arthritis Father     Social History   Social History  . Marital Status: Married    Spouse Name: N/A  . Number of Children: 0  . Years of Education: 12+   Occupational History  .  Not on file.   Social History Main Topics  . Smoking status: Never Smoker   . Smokeless tobacco: Never Used  . Alcohol Use: No  . Drug Use: No  . Sexual Activity: No   Other Topics Concern  . Not on file   Social History Narrative   Patient lives at home alone with 2 cats.    Patient has no children.    Patient is single.    Patient has 2 years of college.      Constitutional: Denies fever, malaise, fatigue, headache or abrupt weight changes.  Neurological: Denies dizziness, difficulty with memory, difficulty with speech or problems with balance and coordination.  Psych: Pt reports anxiety and depression. Denies SI/HI.  No other specific complaints in a complete review of systems (except as listed in HPI above).     Objective:   Physical Exam  BP 136/88 mmHg  Pulse 53  Temp(Src) 97.8 F (36.6 C) (Oral)  Wt 234 lb 12 oz (106.482 kg)  SpO2 98% Wt Readings from Last 3 Encounters:  03/24/16 234 lb 12 oz (106.482 kg)  10/11/15 240 lb 8 oz (109.09 kg)  09/24/15 240 lb (108.863 kg)  General: Appears her stated age, obese in NAD. Cardiovascular: Normal rate and rhythm. S1,S2 noted.  No murmur, rubs or gallops noted. No JVD or BLE edema. Pulmonary/Chest: Normal effort and positive vesicular breath sounds. No respiratory distress. No wheezes, rales or ronchi noted.  Neurological: Alert and oriented.  Psychiatric: She is tearful today.      Assessment & Plan:

## 2016-03-24 NOTE — Patient Instructions (Signed)
Generalized Anxiety Disorder Generalized anxiety disorder (GAD) is a mental disorder. It interferes with life functions, including relationships, work, and school. GAD is different from normal anxiety, which everyone experiences at some point in their lives in response to specific life events and activities. Normal anxiety actually helps us prepare for and get through these life events and activities. Normal anxiety goes away after the event or activity is over.  GAD causes anxiety that is not necessarily related to specific events or activities. It also causes excess anxiety in proportion to specific events or activities. The anxiety associated with GAD is also difficult to control. GAD can vary from mild to severe. People with severe GAD can have intense waves of anxiety with physical symptoms (panic attacks).  SYMPTOMS The anxiety and worry associated with GAD are difficult to control. This anxiety and worry are related to many life events and activities and also occur more days than not for 6 months or longer. People with GAD also have three or more of the following symptoms (one or more in children):  Restlessness.   Fatigue.  Difficulty concentrating.   Irritability.  Muscle tension.  Difficulty sleeping or unsatisfying sleep. DIAGNOSIS GAD is diagnosed through an assessment by your health care provider. Your health care provider will ask you questions aboutyour mood,physical symptoms, and events in your life. Your health care provider may ask you about your medical history and use of alcohol or drugs, including prescription medicines. Your health care provider may also do a physical exam and blood tests. Certain medical conditions and the use of certain substances can cause symptoms similar to those associated with GAD. Your health care provider may refer you to a mental health specialist for further evaluation. TREATMENT The following therapies are usually used to treat GAD:    Medication. Antidepressant medication usually is prescribed for long-term daily control. Antianxiety medicines may be added in severe cases, especially when panic attacks occur.   Talk therapy (psychotherapy). Certain types of talk therapy can be helpful in treating GAD by providing support, education, and guidance. A form of talk therapy called cognitive behavioral therapy can teach you healthy ways to think about and react to daily life events and activities.  Stress managementtechniques. These include yoga, meditation, and exercise and can be very helpful when they are practiced regularly. A mental health specialist can help determine which treatment is best for you. Some people see improvement with one therapy. However, other people require a combination of therapies.   This information is not intended to replace advice given to you by your health care provider. Make sure you discuss any questions you have with your health care provider.   Document Released: 01/24/2013 Document Revised: 10/20/2014 Document Reviewed: 01/24/2013 Elsevier Interactive Patient Education 2016 [employer]vier Inc.  

## 2016-03-24 NOTE — Assessment & Plan Note (Signed)
Deteriorated secondary to her situation at work She is not interested in adjusting her medication at this time She is planning to change employment Zoloft refilled today Continue Clonazepam as directed by neurology

## 2016-04-14 ENCOUNTER — Ambulatory Visit: Payer: Commercial Managed Care - PPO | Admitting: Family Medicine

## 2016-04-25 ENCOUNTER — Telehealth: Payer: Self-pay

## 2016-04-25 NOTE — Telephone Encounter (Signed)
Dr Dayton MartesAron pt but Nicki Reaperegina Baity saw her about 2 weeks ago.Marland Kitchen.Marland Kitchen.Marland Kitchen.Pt walked into the office requesting a note to take to her employer in reference to her mental state as pt states she had to quit her job due to the high level of stress that increased her anxiety. The employer is refusing to pay out her vacation pay because she did not give the proper notice. Pt is requesting a note stating that she had to quit her job without notice due to "nervous breakdown"... Pt has office visit scheduled for Monday 04/28/2016 and [facility] address and phone number if pt felt that she needed some support now---pt accepted information---please advise

## 2016-04-25 NOTE — Telephone Encounter (Signed)
I can give her a note stating that I saw her 2 weeks ago for work related stress and anxiety, but I don't feel comfortable writing a note that says she had to quit her job.

## 2016-04-28 ENCOUNTER — Encounter: Payer: Self-pay | Admitting: *Deleted

## 2016-04-28 ENCOUNTER — Ambulatory Visit: Payer: Commercial Managed Care - PPO | Admitting: Family Medicine

## 2016-04-28 NOTE — Telephone Encounter (Signed)
Spoke to pt and advised that per Dr Dayton MartesAron, we are unable to write a letter for her resignation. We are able to provide a letter stating she was seen for an anxiety related issue, which pt is agreeable to receiving. Letter left at the front desk for pt to pickup and appt cancelled

## 2016-09-23 ENCOUNTER — Ambulatory Visit: Payer: Commercial Managed Care - PPO | Admitting: Nurse Practitioner

## 2016-09-23 ENCOUNTER — Other Ambulatory Visit: Payer: Self-pay | Admitting: Diagnostic Neuroimaging

## 2016-09-24 NOTE — Telephone Encounter (Signed)
Prescription faxed to pharmacy; fax successful.

## 2016-10-20 ENCOUNTER — Ambulatory Visit: Payer: Commercial Managed Care - PPO | Admitting: Nurse Practitioner

## 2016-10-27 ENCOUNTER — Encounter: Payer: Self-pay | Admitting: Nurse Practitioner

## 2016-10-27 ENCOUNTER — Ambulatory Visit (INDEPENDENT_AMBULATORY_CARE_PROVIDER_SITE_OTHER): Payer: BLUE CROSS/BLUE SHIELD | Admitting: Nurse Practitioner

## 2016-10-27 VITALS — BP 133/66 | HR 61 | Ht 66.0 in | Wt 229.8 lb

## 2016-10-27 DIAGNOSIS — R259 Unspecified abnormal involuntary movements: Secondary | ICD-10-CM

## 2016-10-27 DIAGNOSIS — G249 Dystonia, unspecified: Secondary | ICD-10-CM

## 2016-10-27 MED ORDER — CLONAZEPAM 0.5 MG PO TABS: 0.5000 mg | ORAL_TABLET | Freq: Two times a day (BID) | ORAL | 5 refills | Status: DC

## 2016-10-27 NOTE — Patient Instructions (Signed)
Continue clonazepam at current dose RX to patient Follow-up yearly and when necessary

## 2016-10-27 NOTE — Progress Notes (Signed)
[facility]  PATIENT: Kiara Haas DOB: August 03, 1956   REASON FOR VISIT: Follow-up for abnormal involuntary movements, dystonia of the right upper extremity HISTORY FROM: Patient    HISTORY OF PRESENT ILLNESS:Kiara Haas, 61 year old female returns for followup. She was last seen 09/24/15. 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 She denies any difficulty performing her job, writing or performing other tasks, prior to resigning her job. She is currently looking for work She is on an antidepressant by her primary care. She has no new neurologic complaints. She returns for reevaluation. She is getting over the flu .She needs refills on medication.   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 When initially diagnosed her symptoms were variable, sometimes mild and sometimes interfering with her ability to write and perform fine function movements She has a remote history of epilepsy, she has had no seizure activity. She denies any difficulty writing or performing other tasks now, she has had no problems with her job. Hx of depression and been placed on any anti-depressant in recent years. No new neurologic complaints.    REVIEW OF SYSTEMS: Full 14 system review of systems performed and notable only for those listed, all others are neg:  Constitutional: neg  Cardiovascular: neg Ear/Nose/Throat: neg  Skin: neg Eyes: neg Respiratory: Cough Gastroitestinal: neg  Hematology/Lymphatic: neg  Endocrine: neg Musculoskeletal:neg Allergy/Immunology: neg Neurological: neg Psychiatric: Depression Sleep : neg   ALLERGIES: Allergies  Allergen Reactions  . Cephalexin Other (See Comments)    Blisters in the mouth  . Paxil [Paroxetine Hcl]     Blisters in mouth and over body  . Cefuroxime Axetil Swelling    HOME  MEDICATIONS: Outpatient Medications Prior to Visit  Medication Sig Dispense Refill  . Calcium Carbonate (CALTRATE 600 PO) Take 600 mg by mouth daily.    . clonazePAM (KLONOPIN) 0.5 MG tablet TAKE 1 TABLET BY MOUTH TWICE DAILY 60 tablet 0  . cyclobenzaprine (FLEXERIL) 10 MG tablet Take 10 mg by mouth as needed.     . sertraline (ZOLOFT) 50 MG tablet TAKE 1 TABLET BY MOUTH DAILY. 30 tablet 11   No facility-administered medications prior to visit.     PAST MEDICAL HISTORY: Past Medical History:  Diagnosis Date  . Allergy   . Depression with anxiety   . Dystonia    hand  . Migraine   . Multiple gastric ulcers   . Sciatica    bilateral  . Seizures (HCC)     PAST SURGICAL HISTORY: Past Surgical History:  Procedure Laterality Date  . BREAST BIOPSY  1993  . EYE SURGERY     times 2 age 63 and 44 lazy eye  . polyps burned    . tonsils  1963    FAMILY HISTORY: Family History  Problem Relation Age of Onset  . Diabetes Mother   . COPD Mother   . Arthritis Mother   . Arthritis Father   . Breast cancer Sister   . Pancreatic cancer Maternal Aunt   . Pancreatic cancer Paternal Aunt   . Pancreatic cancer Paternal Grandmother   . Heart disease Brother   . Diabetes Sister   . Diabetes Brother     SOCIAL HISTORY: Social History   Social History  . Marital status: Married    Spouse name: N/A  . Number of children: 0  . Years  of education: 12+   Occupational History  . Not on file.   Social History Main Topics  . Smoking status: Never Smoker  . Smokeless tobacco: Never Used  . Alcohol use No  . Drug use: No  . Sexual activity: No   Other Topics Concern  . Not on file   Social History Narrative   Patient lives at home alone with 2 cats.    Patient has no children.    Patient is single.    Patient has 2 years of college.      PHYSICAL EXAM  Vitals:   10/27/16 1547  BP: 133/66  Pulse: 61  Weight: 229 lb 12.8 oz (104.2 kg)  Height: 5\' 6"  (1.676 m)   Body  mass index is 37.09 kg/m. Generalized: Well developed, obese female  no acute distress   Neurological examination   Mentation: Alert oriented to time, place, history taking. Follows all commands speech and language fluent Cranial nerve II-XII: Pupils were equal round reactive to light extraocular movements were full, visual field were full on confrontational test. Facial sensation and strength were normal. hearing was intact to finger rubbing bilaterally. Uvula tongue midline. head turning and shoulder shrug were normal and symmetric.Tongue protrusion into cheek strength was normal. Motor: normal bulk and tone, full strength in the BUE, BLE, fine finger movements normal, no pronator drift. No focal weakness Coordination: finger-nose-finger, heel-to-shin bilaterally, no dysmetria Reflexes: Brachioradialis 2/2, biceps 2/2, triceps 2/2, patellar 2/2, Achilles 2/2, plantar responses were flexor bilaterally. Gait and Station: Rising up from seated position without assistance, normal stance, moderate stride, good arm swing, smooth turning, able to perform tiptoe, and heel walking without difficulty. Tandem gait is steady   DIAGNOSTIC DATA (LABS, IMAGING, TESTING) - ASSESSMENT AND PLAN 61 y.o. year old female  has a past medical history of Depression with anxiety; Migraine; and Dystonia. here to follow-up for her right upper extremity dystonia.The patient is a current patient of Dr. Marjory LiesPenumalli  who is out of the office today . This note is sent to the work in doctor.     Continue clonazepam at current dose RX to patient Follow-up yearly and when necessary  Nilda RiggsNancy Carolyn Martin, [facility]GNP, [facility], APRN  [facility] [address], [address] [place], [place] [id number] [phone] He is well. Charts documenting all

## 2016-10-29 NOTE — Progress Notes (Signed)
I agree with the above plan 

## 2017-03-25 ENCOUNTER — Other Ambulatory Visit: Payer: Self-pay | Admitting: Internal Medicine

## 2017-04-14 ENCOUNTER — Ambulatory Visit: Payer: Commercial Managed Care - PPO | Admitting: Family Medicine

## 2017-04-16 ENCOUNTER — Ambulatory Visit (INDEPENDENT_AMBULATORY_CARE_PROVIDER_SITE_OTHER): Payer: BLUE CROSS/BLUE SHIELD | Admitting: Family Medicine

## 2017-04-16 ENCOUNTER — Encounter: Payer: Self-pay | Admitting: Family Medicine

## 2017-04-16 VITALS — BP 120/82 | HR 85 | Ht 66.0 in | Wt 235.0 lb

## 2017-04-16 DIAGNOSIS — F419 Anxiety disorder, unspecified: Secondary | ICD-10-CM | POA: Diagnosis not present

## 2017-04-16 DIAGNOSIS — F329 Major depressive disorder, single episode, unspecified: Secondary | ICD-10-CM | POA: Diagnosis not present

## 2017-04-16 DIAGNOSIS — F32A Depression, unspecified: Secondary | ICD-10-CM

## 2017-04-16 MED ORDER — SERTRALINE HCL 50 MG PO TABS: 50.0000 mg | ORAL_TABLET | Freq: Every day | ORAL | 6 refills | Status: DC

## 2017-04-16 NOTE — Patient Instructions (Signed)
Great to see you.  I think you are an amazingly strong and wonderful person.

## 2017-04-16 NOTE — Assessment & Plan Note (Signed)
>  15 minutes spent in face to face time with patient, >50% spent in counselling or coordination of care. Well controlled on current dose of zoloft.   No changes made- eRx refills sent.

## 2017-04-16 NOTE — Progress Notes (Signed)
Subjective:   Patient ID: Kiara Haas, female    DOB: [birth year], 61 y.o.   MRN: [id number]  Kiara Haas is a pleasant 61 y.o. year old female who presents to clinic today with Medication Refill  on 04/16/2017  HPI:  Anxiety/depression-  Has been well controlled on zoloft 50 mg daily. Has lost her job a year ago and had more anxiety and depression when that first happened.  She is feeling better now.  She needs this refilled today.    Current Outpatient Prescriptions on File Prior to Visit  Medication Sig Dispense Refill  . Calcium Carbonate (CALTRATE 600 PO) Take 600 mg by mouth daily.    . clonazePAM (KLONOPIN) 0.5 MG tablet Take 1 tablet (0.5 mg total) by mouth 2 (two) times daily. 60 tablet 5  . cyclobenzaprine (FLEXERIL) 10 MG tablet Take 10 mg by mouth as needed.      No current facility-administered medications on file prior to visit.     Allergies  Allergen Reactions  . Cephalexin Other (See Comments)    Blisters in the mouth  . Paxil [Paroxetine Hcl]     Blisters in mouth and over body  . Cefuroxime Axetil Swelling    Past Medical History:  Diagnosis Date  . Allergy   . Depression with anxiety   . Dystonia    hand  . Migraine   . Multiple gastric ulcers   . Sciatica    bilateral  . Seizures (HCC)     Past Surgical History:  Procedure Laterality Date  . BREAST BIOPSY  1993  . EYE SURGERY     times 2 age 54 and 2 lazy eye  . polyps burned    . tonsils  1963    Family History  Problem Relation Age of Onset  . Diabetes Mother   . COPD Mother   . Arthritis Mother   . Arthritis Father   . Breast cancer Sister   . Pancreatic cancer Maternal Aunt   . Pancreatic cancer Paternal Aunt   . Pancreatic cancer Paternal Grandmother   . Heart disease Brother   . Diabetes Sister   . Diabetes Brother     Social History   Social History  . Marital status: Married    Spouse name: N/A  . Number of children: 0  . Years of education: 12+    Occupational History  . Not on file.   Social History Main Topics  . Smoking status: Never Smoker  . Smokeless tobacco: Never Used  . Alcohol use No  . Drug use: No  . Sexual activity: No   Other Topics Concern  . Not on file   Social History Narrative   Patient lives at home alone with 2 cats.    Patient has no children.    Patient is single.    Patient has 2 years of college.    The PMH, PSH, Social History, Family History, Medications, and allergies have been reviewed in [facility], and have been updated if relevant.   Review of Systems  Psychiatric/Behavioral: Negative for dysphoric mood, hallucinations, self-injury, sleep disturbance and suicidal ideas. The patient is not nervous/anxious and is not hyperactive.   All other systems reviewed and are negative.      Objective:    BP 120/82   Pulse 85   Ht 5\' 6"  (1.676 m)   Wt 235 lb (106.6 kg)   SpO2 95%   BMI 37.93 kg/m    Physical Exam  Constitutional: She is oriented to person, place, and time. She appears well-developed and well-nourished. No distress.  HENT:  Head: Normocephalic and atraumatic.  Eyes: Conjunctivae are normal.  Cardiovascular: Normal rate.   Pulmonary/Chest: Effort normal.  Musculoskeletal: Normal range of motion. She exhibits no edema.  Neurological: She is alert and oriented to person, place, and time. No cranial nerve deficit.  Skin: Skin is warm and dry. She is not diaphoretic.  Psychiatric: She has a normal mood and affect. Her behavior is normal. Judgment and thought content normal.  Nursing note and vitals reviewed.         Assessment & Plan:   Anxiety and depression No Follow-up on file.

## 2017-04-23 ENCOUNTER — Telehealth: Payer: Self-pay | Admitting: Nurse Practitioner

## 2017-04-23 NOTE — Telephone Encounter (Signed)
Fax confirmation received clanzaepam [phone].

## 2017-04-28 ENCOUNTER — Encounter: Payer: Self-pay | Admitting: Family Medicine

## 2017-04-28 ENCOUNTER — Ambulatory Visit (INDEPENDENT_AMBULATORY_CARE_PROVIDER_SITE_OTHER): Payer: BLUE CROSS/BLUE SHIELD | Admitting: Family Medicine

## 2017-04-28 ENCOUNTER — Other Ambulatory Visit (HOSPITAL_COMMUNITY)
Admission: RE | Admit: 2017-04-28 | Discharge: 2017-04-28 | Disposition: A | Discharge status: Home / Self Care | Payer: BLUE CROSS/BLUE SHIELD | Source: Ambulatory Visit | Attending: Family Medicine | Admitting: Family Medicine

## 2017-04-28 VITALS — BP 142/90 | HR 69 | Ht 66.5 in | Wt 231.0 lb

## 2017-04-28 DIAGNOSIS — F419 Anxiety disorder, unspecified: Secondary | ICD-10-CM | POA: Diagnosis not present

## 2017-04-28 DIAGNOSIS — Z01419 Encounter for gynecological examination (general) (routine) without abnormal findings: Secondary | ICD-10-CM | POA: Insufficient documentation

## 2017-04-28 DIAGNOSIS — F329 Major depressive disorder, single episode, unspecified: Secondary | ICD-10-CM

## 2017-04-28 DIAGNOSIS — F32A Depression, unspecified: Secondary | ICD-10-CM

## 2017-04-28 DIAGNOSIS — Z124 Encounter for screening for malignant neoplasm of cervix: Secondary | ICD-10-CM

## 2017-04-28 DIAGNOSIS — Z1211 Encounter for screening for malignant neoplasm of colon: Secondary | ICD-10-CM | POA: Diagnosis not present

## 2017-04-28 LAB — CBC WITH DIFFERENTIAL/PLATELET
BASOS PCT: 0.6 % (ref 0.0–3.0)
Basophils Absolute: 0 10*3/uL (ref 0.0–0.1)
EOS PCT: 0.8 % (ref 0.0–5.0)
Eosinophils Absolute: 0 10*3/uL (ref 0.0–0.7)
HEMATOCRIT: 41.5 % (ref 36.0–46.0)
HEMOGLOBIN: 14 g/dL (ref 12.0–15.0)
LYMPHS PCT: 36.9 % (ref 12.0–46.0)
Lymphs Abs: 1.7 10*3/uL (ref 0.7–4.0)
MCHC: 33.7 g/dL (ref 30.0–36.0)
MCV: 87 fl (ref 78.0–100.0)
MONOS PCT: 9.2 % (ref 3.0–12.0)
Monocytes Absolute: 0.4 10*3/uL (ref 0.1–1.0)
NEUTROS ABS: 2.4 10*3/uL (ref 1.4–7.7)
Neutrophils Relative %: 52.5 % (ref 43.0–77.0)
Platelets: 186 10*3/uL (ref 150.0–400.0)
RBC: 4.77 Mil/uL (ref 3.87–5.11)
RDW: 13.3 % (ref 11.5–15.5)
WBC: 4.6 10*3/uL (ref 4.0–10.5)

## 2017-04-28 LAB — COMPREHENSIVE METABOLIC PANEL
ALBUMIN: 4.5 g/dL (ref 3.5–5.2)
ALT: 14 U/L (ref 0–35)
AST: 15 U/L (ref 0–37)
Alkaline Phosphatase: 57 U/L (ref 39–117)
BUN: 19 mg/dL (ref 6–23)
CALCIUM: 9.5 mg/dL (ref 8.4–10.5)
CHLORIDE: 104 meq/L (ref 96–112)
CO2: 26 mEq/L (ref 19–32)
CREATININE: 0.75 mg/dL (ref 0.40–1.20)
GFR: 83.63 mL/min (ref >60.00)
Glucose, Bld: 99 mg/dL (ref 70–99)
POTASSIUM: 3.8 meq/L (ref 3.5–5.1)
Sodium: 139 mEq/L (ref 135–145)
Total Bilirubin: 0.7 mg/dL (ref 0.2–1.2)
Total Protein: 7.3 g/dL (ref 6.0–8.3)

## 2017-04-28 LAB — TSH: TSH: 1.37 u[IU]/mL (ref 0.35–4.50)

## 2017-04-28 LAB — LIPID PANEL
CHOLESTEROL: 220 mg/dL — AB (ref 0–200)
HDL: 58.3 mg/dL (ref >39.00)
LDL Cholesterol: 138 mg/dL — ABNORMAL HIGH (ref 0–99)
NonHDL: 161.5
TRIGLYCERIDES: 116 mg/dL (ref 0.0–149.0)
Total CHOL/HDL Ratio: 4
VLDL: 23.2 mg/dL (ref 0.0–40.0)

## 2017-04-28 NOTE — Progress Notes (Signed)
Subjective:   Patient ID: Kiara Haas, female    DOB: [birth year], 61 y.o.   MRN: [id number]  Kiara Haas is a pleasant 61 y.o. year old female who presents to clinic today with Annual Exam (going for a mammo tomorrow 04/29/17, )  on 04/28/2017  HPI:  Mammogram scheduled for tomorrow. Due for pap smear.    Anxiety/depression-  Has been well controlled on zoloft 50 mg daily. Has lost her job a year ago and had more anxiety and depression when that first happened.  She is feeling better now.  No results found for: CHOL, HDL, LDLCALC, LDLDIRECT, TRIG, CHOLHDL  Current Outpatient Prescriptions on File Prior to Visit  Medication Sig Dispense Refill  . Calcium Carbonate (CALTRATE 600 PO) Take 600 mg by mouth daily.    . clonazePAM (KLONOPIN) 0.5 MG tablet TAKE 1 TABLET BY MOUTH TWICE DAILY 60 tablet 0  . cyclobenzaprine (FLEXERIL) 10 MG tablet Take 10 mg by mouth as needed.     . sertraline (ZOLOFT) 50 MG tablet Take 1 tablet (50 mg total) by mouth daily. 30 tablet 6   No current facility-administered medications on file prior to visit.     Allergies  Allergen Reactions  . Cephalexin Other (See Comments)    Blisters in the mouth  . Paxil [Paroxetine Hcl]     Blisters in mouth and over body  . Cefuroxime Axetil Swelling    Past Medical History:  Diagnosis Date  . Allergy   . Depression with anxiety   . Dystonia    hand  . Migraine   . Multiple gastric ulcers   . Sciatica    bilateral  . Seizures (HCC)     Past Surgical History:  Procedure Laterality Date  . BREAST BIOPSY  1993  . EYE SURGERY     times 2 age 89 and [age] lazy eye  . polyps burned    . tonsils  1963    Family History  Problem Relation Age of Onset  . Diabetes Mother   . COPD Mother   . Arthritis Mother   . Arthritis Father   . Breast cancer Sister   . Pancreatic cancer Maternal Aunt   . Pancreatic cancer Paternal Aunt   . Pancreatic cancer Paternal Grandmother   . Heart disease Brother     . Diabetes Sister   . Diabetes Brother     Social History   Social History  . Marital status: Married    Spouse name: N/A  . Number of children: 0  . Years of education: 12+   Occupational History  . Not on file.   Social History Main Topics  . Smoking status: Never Smoker  . Smokeless tobacco: Never Used  . Alcohol use No  . Drug use: No  . Sexual activity: No   Other Topics Concern  . Not on file   Social History Narrative   Patient lives at home alone with 2 cats.    Patient has no children.    Patient is single.    Patient has 2 years of college.    The PMH, PSH, Social History, Family History, Medications, and allergies have been reviewed in [facility], and have been updated if relevant.   Review of Systems  Constitutional: Negative.   HENT: Negative.   Eyes: Negative.   Respiratory: Negative.   Cardiovascular: Negative.   Gastrointestinal: Negative.   Endocrine: Negative.   Genitourinary: Negative.   Musculoskeletal: Negative.   Skin: Negative.  Allergic/Immunologic: Negative.   Neurological: Negative.   Hematological: Negative.   Psychiatric/Behavioral: Negative.   All other systems reviewed and are negative.      Objective:    BP (!) 142/90   Pulse 69   Ht 5' 6.5" (1.689 m)   Wt 231 lb (104.8 kg)   SpO2 98%   BMI 36.73 kg/m    Physical Exam   General:  Well-developed,well-nourished,in no acute distress; alert,appropriate and cooperative throughout examination Head:  normocephalic and atraumatic.   Eyes:  vision grossly intact, PERRL Ears:  R ear normal and L ear normal externally, TMs clear bilaterally Nose:  no external deformity.   Mouth:  good dentition.   Neck:  No deformities, masses, or tenderness noted. Breasts:  No mass, nodules, thickening, tenderness, bulging, retraction, inflamation, nipple discharge or skin changes noted.   Lungs:  Normal respiratory effort, chest expands symmetrically. Lungs are clear to auscultation, no  crackles or wheezes. Heart:  Normal rate and regular rhythm. S1 and S2 normal without gallop, murmur, click, rub or other extra sounds. Abdomen:  Bowel sounds positive,abdomen soft and non-tender without masses, organomegaly or hernias noted. Rectal:  no external abnormalities.   Genitalia:  Pelvic Exam:        External: normal female genitalia without lesions or masses        Vagina: normal without lesions or masses        Cervix: normal without lesions or masses        Adnexa: normal bimanual exam without masses or fullness        Uterus: normal by palpation        Pap smear: performed Msk:  No deformity or scoliosis noted of thoracic or lumbar spine.   Extremities:  No clubbing, cyanosis, edema, or deformity noted with normal full range of motion of all joints.   Neurologic:  alert & oriented X3 and gait normal.   Skin:  Intact without suspicious lesions or rashes Cervical Nodes:  No lymphadenopathy noted Axillary Nodes:  No palpable lymphadenopathy Psych:  Cognition and judgment appear intact. Alert and cooperative with normal attention span and concentration. No apparent delusions, illusions, hallucinations         Assessment & Plan:   Well woman exam - Plan: Hepatitis C Antibody, HIV antibody (with reflex), CBC with Differential/Platelet, Comprehensive metabolic panel, Lipid panel, TSH  Anxiety and depression No Follow-up on file.

## 2017-04-28 NOTE — Addendum Note (Signed)
Addended by: Gregery NaVALENCIA, Javiel Canepa P on: 04/28/2017 12:28 PM   Modules accepted: Orders

## 2017-04-28 NOTE — Assessment & Plan Note (Signed)
Reviewed preventive care protocols, scheduled due services, and updated immunizations Discussed nutrition, exercise, diet, and healthy lifestyle.  

## 2017-04-28 NOTE — Addendum Note (Signed)
Addended by: Alvina ChouWALSH, TERRI J on: 04/28/2017 01:21 PM   Modules accepted: Orders

## 2017-04-28 NOTE — Progress Notes (Signed)
Pre visit review using our clinic review tool, if applicable. No additional management support is needed unless otherwise documented below in the visit note. 

## 2017-04-28 NOTE — Assessment & Plan Note (Signed)
Well controlled. No changes made to rxs. 

## 2017-04-29 LAB — HEPATITIS C ANTIBODY: HCV AB: REACTIVE — AB

## 2017-04-29 LAB — CYTOLOGY - PAP
DIAGNOSIS: NEGATIVE
HPV (WINDOPATH): NOT DETECTED

## 2017-04-29 LAB — HIV ANTIBODY (ROUTINE TESTING W REFLEX): HIV: NONREACTIVE

## 2017-04-30 LAB — HEPATITIS C RNA QUANTITATIVE
HCV QUANT LOG: NOT DETECTED {Log_IU}/mL
HCV QUANT: NOT DETECTED [IU]/mL

## 2017-05-08 ENCOUNTER — Other Ambulatory Visit (INDEPENDENT_AMBULATORY_CARE_PROVIDER_SITE_OTHER): Payer: BLUE CROSS/BLUE SHIELD

## 2017-05-08 DIAGNOSIS — Z1211 Encounter for screening for malignant neoplasm of colon: Secondary | ICD-10-CM | POA: Diagnosis not present

## 2017-05-08 LAB — FECAL OCCULT BLOOD, IMMUNOCHEMICAL: FECAL OCCULT BLD: NEGATIVE

## 2017-05-28 MED ORDER — CLONAZEPAM 0.5 MG PO TABS: 0.5000 mg | ORAL_TABLET | Freq: Two times a day (BID) | ORAL | 5 refills | Status: DC

## 2017-05-28 NOTE — Telephone Encounter (Signed)
Pt calling re: clonazePAM (KLONOPIN) 0.5 MG tablet, she said she has been trying since July the pharmacy gave her a courtesy month supply but is not in need of her 6 month prescription that she states Eber JonesCarolyn generally gives her.  Pt still uses  [phone] [place] - [address] [facility] [address] [phone] (Phone) [phone] (Fax)   Pt not requesting a call back, pharmacy waiting on  Prescription refill

## 2017-05-28 NOTE — Addendum Note (Signed)
Addended by: Guy BeginYOUNG, SANDRA S on: 05/28/2017 03:46 PM   Modules accepted: Orders

## 2017-05-28 NOTE — Telephone Encounter (Signed)
CM/NP out of office, send to Dr. Marjory LiesPenumalli for prescription signature.

## 2017-05-28 NOTE — Telephone Encounter (Signed)
LMVM for pt that faxed with confirmation the prescription for her klonopin 0.5mg  po bid #60 with 5 refills.

## 2017-10-26 NOTE — Progress Notes (Signed)
[facility]  PATIENT: Kiara FergusonSusan S Bethards DOB: 02-Mar-1956   REASON FOR VISIT: Follow-up for abnormal involuntary movements, dystonia of the right upper extremity HISTORY FROM: Patient    HISTORY OF PRESENT ILLNESS:Kiara Haas, 62 year old female returns for yearly followup.  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  She can tell if she misses a dose.  She denies any difficulty performing her job, writing or performing other tasks, prior to resigning her job. She is currently looking for full-time work.  She has a part-time job.  She is on an antidepressant by her primary care. She has no new neurologic complaints. She returns for reevaluation.    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 When initially diagnosed her symptoms were variable, sometimes mild and sometimes interfering with her ability to write and perform fine function movements She has a remote history of epilepsy, she has had no seizure activity. She denies any difficulty writing or performing other tasks now, she has had no problems with her job. Hx of depression and been placed on any anti-depressant in recent years. No new neurologic complaints.    REVIEW OF SYSTEMS: Full 14 system review of systems performed and notable only for those listed, all others are neg:  Constitutional: neg  Cardiovascular: neg Ear/Nose/Throat: neg  Skin: neg Eyes: neg Respiratory: Cough Gastroitestinal: neg  Hematology/Lymphatic: neg  Endocrine: neg Musculoskeletal:neg Allergy/Immunology: neg Neurological: neg Psychiatric: Depression Sleep : neg   ALLERGIES: Allergies  Allergen Reactions  . Cephalexin Other (See Comments)    Blisters in the mouth  . Paxil [Paroxetine Hcl]     Blisters in mouth and over body  . Cefuroxime Axetil Swelling    HOME MEDICATIONS: Outpatient  Medications Prior to Visit  Medication Sig Dispense Refill  . Calcium Carbonate (CALTRATE 600 PO) Take 600 mg by mouth daily.    . clonazePAM (KLONOPIN) 0.5 MG tablet Take 1 tablet (0.5 mg total) by mouth 2 (two) times daily. 60 tablet 5  . cyclobenzaprine (FLEXERIL) 10 MG tablet Take 10 mg by mouth as needed.     . sertraline (ZOLOFT) 50 MG tablet Take 1 tablet (50 mg total) by mouth daily. 30 tablet 6   No facility-administered medications prior to visit.     PAST MEDICAL HISTORY: Past Medical History:  Diagnosis Date  . Allergy   . Depression with anxiety   . Dystonia    hand  . Migraine   . Multiple gastric ulcers   . Sciatica    bilateral  . Seizures (HCC)     PAST SURGICAL HISTORY: Past Surgical History:  Procedure Laterality Date  . BREAST BIOPSY  1993  . EYE SURGERY     times 2 age 89 and [age] lazy eye  . polyps burned    . tonsils  1963    FAMILY HISTORY: Family History  Problem Relation Age of Onset  . Diabetes Mother   . COPD Mother   . Arthritis Mother   . Arthritis Father   . Breast cancer Sister   . Breast cancer Maternal Aunt   . Breast cancer Paternal Aunt   . Pancreatic cancer Paternal Grandmother   . Heart disease Brother   . Diabetes Sister   . Diabetes Brother   . Heart disease Brother     SOCIAL HISTORY: Social History   Socioeconomic History  .  Marital status: Married    Spouse name: Not on file  . Number of children: 0  . Years of education: 12+  . Highest education level: Not on file  Social Needs  . Financial resource strain: Not on file  . Food insecurity - worry: Not on file  . Food insecurity - inability: Not on file  . Transportation needs - medical: Not on file  . Transportation needs - non-medical: Not on file  Occupational History  . Not on file  Tobacco Use  . Smoking status: Never Smoker  . Smokeless tobacco: Never Used  Substance and Sexual Activity  . Alcohol use: No  . Drug use: No  . Sexual activity: No    Other Topics Concern  . Not on file  Social History Narrative   Patient lives at home alone with 2 cats.    Patient has no children.    Patient is single.    Patient has 2 years of college.      PHYSICAL EXAM  Vitals:   10/27/17 1538  BP: 136/86  Pulse: 60  Weight: 230 lb 3.2 oz (104.4 kg)   Body mass index is 36.6 kg/m. Generalized: Well developed, obese female  no acute distress   Neurological examination   Mentation: Alert oriented to time, place, history taking. Follows all commands speech and language fluent Cranial nerve II-XII: Pupils were equal round reactive to light extraocular movements were full, visual field were full on confrontational test. Facial sensation and strength were normal. hearing was intact to finger rubbing bilaterally. Uvula tongue midline. head turning and shoulder shrug were normal and symmetric.Tongue protrusion into cheek strength was normal. Motor: normal bulk and tone, full strength in the BUE, BLE, fine finger movements normal, no pronator drift. No focal weakness Coordination: finger-nose-finger, heel-to-shin bilaterally, no dysmetria Reflexes: Symmetric upper and lower, plantar responses were flexor bilaterally. Gait and Station: Rising up from seated position without assistance, normal stance, moderate stride, good arm swing, smooth turning, able to perform tiptoe, and heel walking without difficulty. Tandem gait is steady   DIAGNOSTIC DATA (LABS, IMAGING, TESTING) - ASSESSMENT AND PLAN 62 y.o. year old female  has a past medical history of Depression with anxiety; Migraine; and Dystonia. here to follow-up for her right upper extremity dystonia.  Her symptoms are well controlled on clonazepam.  Continue clonazepam at current dose  Follow-up yearly and when necessary Given patient specific information on dystonia and reviewed this with her  Kiara Haas, [facility], [facility], APRN  [facility] [address], [address] [place], [place] [address] [phone] He is well. Charts documenting all

## 2017-10-27 ENCOUNTER — Ambulatory Visit: Payer: BLUE CROSS/BLUE SHIELD | Admitting: Nurse Practitioner

## 2017-10-27 ENCOUNTER — Encounter: Payer: Self-pay | Admitting: Nurse Practitioner

## 2017-10-27 VITALS — BP 136/86 | HR 60 | Wt 230.2 lb

## 2017-10-27 DIAGNOSIS — R259 Unspecified abnormal involuntary movements: Secondary | ICD-10-CM | POA: Diagnosis not present

## 2017-10-27 DIAGNOSIS — G249 Dystonia, unspecified: Secondary | ICD-10-CM | POA: Insufficient documentation

## 2017-10-27 MED ORDER — CLONAZEPAM 0.5 MG PO TABS: 0.5000 mg | ORAL_TABLET | Freq: Two times a day (BID) | ORAL | 5 refills | Status: DC

## 2017-10-27 NOTE — Patient Instructions (Addendum)
Continue clonazepam at current dose  Follow-up yearly and when necessaryDystonia Dystonia is a condition that makes your muscles contract without warning (muscle spasms). It can make doing everyday tasks hard. There are different forms of dystonia. The condition can affect just one part of your body, or it can affect larger areas of your body. Dystonia affects people in different ways. In some people, it is mild and goes away over time, while others may need treatment. Although there is no cure for dystonia, you can manage the condition with treatment. What are the causes? Dystonia may be caused by:  Genetics. This means you inherited the genes that cause you to be at risk for dystonia.  An abnormality in the part of your brain that controls movement (basal ganglia).  Dystonia may also be acquired. If you have acquired dystonia, you developed the condition after:  Brain injury.  Infection.  Drug reaction.  The cause of dystonia may also not be known (idiopathic dystonia). What are the signs or symptoms? Signs and symptoms of dystonia can depend on which type of the condition you have. Common signs and symptoms include:  Muscle twitches or spasms around your eyes (blepharospasm).  Foot cramping or dragging.  Pulling of your neck to one side (torticollis).  Muscles spasms of the face.  Spasms of the voice box.  Tremors.  Awkward and painful positions.  Muscle cramping after muscle use.  How is this diagnosed? Your health care provider can diagnose dystonia based on your symptoms and medical history. Your health care provider will also do a physical exam. You may also have:  A blood test to check for genes that cause dystonia.  Brain imaging tests to rule out other causes of your symptoms.  There are no tests that can diagnose other causes of dystonia. How is this treated? There are no treatments that can cure or prevent dystonia. Treatment to manage dystonia may  include:  Injecting the affected muscles with a chemical (botulinum) that blocks muscle spasms. This treatment can block spasms for a few days to a few months.  Medicines to relax muscles.  Follow these instructions at home:  Physical therapy to improve muscle strength and movement may be suggested by your health care provider. Continue your physical therapy exercises at home as instructed by your physical therapist.  Make sure you have a good support system. Let your health care provider know if you are struggling with stress or anxiety.  Keep all follow-up visits as directed by your health care provider. This is important.  Take medicines only as directed by your health care provider. Contact a health care provider if:  Your condition is changing or getting worse.  You need more support at home. This information is not intended to replace advice given to you by your health care provider. Make sure you discuss any questions you have with your health care provider. Document Released: 09/19/2002 Document Revised: 03/06/2016 Document Reviewed: 11/23/2013 Elsevier Interactive Patient Education  [employer]2018 Elsevier Inc.

## 2017-10-27 NOTE — Progress Notes (Signed)
Clon[facility]epam [facility] [place] [place]

## 2017-12-01 ENCOUNTER — Telehealth: Payer: Self-pay | Admitting: Family Medicine

## 2017-12-01 MED ORDER — SERTRALINE HCL 50 MG PO TABS: 50.0000 mg | ORAL_TABLET | Freq: Every day | ORAL | 2 refills | Status: DC

## 2017-12-01 NOTE — Telephone Encounter (Signed)
Sent in 3 months of Zoloft/thx dmf

## 2017-12-01 NOTE — Telephone Encounter (Signed)
Copied from CRM [id number]. Topic: Quick Communication - Rx Refill/Question >> Dec 01, 2017  1:36 PM Guinevere FerrariMorris, Cale Bethard E, NT wrote: Medication: sertraline (ZOLOFT) 50 MG tablet   Has the patient contacted their pharmacy? Yes    (Agent: If no, request that the patient contact the pharmacy for the refill.)   Preferred Pharmacy (with phone number or street name): [phone] [place] - [address] [facility] [address] [phone] (Phone) [phone])     Agent: Please be advised that RX refills may take up to 3 business days. We ask that you follow-up with your pharmacy.

## 2017-12-01 NOTE — Telephone Encounter (Signed)
Rx refill request for provider review: last refill states patient has to have annual exam scheduled( not sure if OK to refill)  Zoloft 50 mg  LOV: 04/28/17  Pharmacy: verified

## 2018-01-08 ENCOUNTER — Ambulatory Visit: Payer: BLUE CROSS/BLUE SHIELD | Admitting: Family Medicine

## 2018-01-08 ENCOUNTER — Encounter: Payer: Self-pay | Admitting: Family Medicine

## 2018-01-08 ENCOUNTER — Ambulatory Visit: Payer: Self-pay

## 2018-01-08 VITALS — BP 110/58 | HR 71 | Temp 98.6°F | Ht 66.5 in

## 2018-01-08 DIAGNOSIS — R42 Dizziness and giddiness: Secondary | ICD-10-CM | POA: Diagnosis not present

## 2018-01-08 DIAGNOSIS — J069 Acute upper respiratory infection, unspecified: Secondary | ICD-10-CM | POA: Insufficient documentation

## 2018-01-08 DIAGNOSIS — R55 Syncope and collapse: Secondary | ICD-10-CM

## 2018-01-08 LAB — BASIC METABOLIC PANEL
BUN: 23 mg/dL (ref 7–25)
CALCIUM: 9 mg/dL (ref 8.6–10.4)
CHLORIDE: 107 mmol/L (ref 98–110)
CO2: 21 mmol/L (ref 20–32)
Creat: 0.77 mg/dL (ref 0.50–0.99)
Glucose, Bld: 94 mg/dL (ref 65–99)
Potassium: 4.1 mmol/L (ref 3.5–5.3)
Sodium: 139 mmol/L (ref 135–146)

## 2018-01-08 LAB — CBC
HCT: 39.7 % (ref 35.0–45.0)
Hemoglobin: 13.8 g/dL (ref 11.7–15.5)
MCH: 29.1 pg (ref 27.0–33.0)
MCHC: 34.8 g/dL (ref 32.0–36.0)
MCV: 83.8 fL (ref 80.0–100.0)
MPV: 12.6 fL — AB (ref 7.5–12.5)
PLATELETS: 210 10*3/uL (ref 140–400)
RBC: 4.74 10*6/uL (ref 3.80–5.10)
RDW: 12.8 % (ref 11.0–15.0)
WBC: 7.7 10*3/uL (ref 3.8–10.8)

## 2018-01-08 MED ORDER — AZITHROMYCIN 250 MG PO TABS: ORAL_TABLET | ORAL | 0 refills | Status: DC

## 2018-01-08 NOTE — Telephone Encounter (Signed)
Pt calling to report that she fainted 3 times within a 10 hour period. Pt attributed the fainting from her doxycycline and cough med that she took for a sinus infection, cough. Pt stated that she initially fainted at 2200 last night, once in the middle of the night as she was walking to the bathroom and this am at 0830. The last time she took her doxycycline was last night at 2100 and fainted 1-1.5 hours after. She stated that she took the cough syrup at 2300 last night.  Injuries: 1st fainting spell caused her to fall on her left leg which has left her limping and using a cane and sustained a bloody nose. She was nauseous and dizzy. 3rd fainting caused sore swollen chin. Pt is alert and orient to person, place and time and denies any dizziness. Pt stated that she is not going to take anymore abx and wants another abx because she has had cough and nasal congestion for 5 weeks. Disposition discussed with Tonya at PCP office who agreed that pt could have OV instead of ED. Pt stated her sister will drive her to her appt. Pt scheduled a 4:00 pm appointment today with Dr. Doreene BurkeKremer.   Reason for Disposition . [1] Age > 50 years  AND [2] now alert and feels fine . Fainted 2 times in one day  Answer Assessment - Initial Assessment Questions 1. ONSET: "How long were you unconscious?" (minutes) "When did it happen?"     Pt not sure but feels it wasn't long. Fainted 3 times 2. CONTENT: "What happened during period of unconsciousness?" (e.g., seizure activity)      Pt not sure about what happened- feels like she wasn't unconscious for long 3. MENTAL STATUS: "Alert and oriented now?" (oriented x 3 = name, month, location)      yes 4. TRIGGER: "What do you think caused the fainting?" "What were you doing just before you fainted?"  (e.g., exercise, sudden standing up, prolonged standing) The doxycycline and cough med she was rx  5. RECURRENT SYMPTOM: "Have you ever passed out before?" If so, ask: "When was the  last time?" and "What happened that time?"      Yes- approximately in High School  6. INJURY: "Did you sustain any injury during the fall?"      Bloody nose hit chin and fell on left leg and is limping 7. CARDIAC SYMPTOMS: "Have you had any of the following symptoms: chest pain, difficulty breathing, palpitations?"     no 8. NEUROLOGIC SYMPTOMS: "Have you had any of the following symptoms: headache, numbness, vertigo, weakness?"   Had dizziness only when fainting 9. GI SYMPTOMS: "Have you had any of the following symptoms: abdominal pain, vomiting, diarrhea, blood in stools?"     Nausea but not right now 10. OTHER SYMPTOMS: "Do you have any other symptoms?"       Pain left leg and is limping, bloody nose and chin is sore from fainting 11. PREGNANCY: "Is there any chance you are pregnant?" "When was your last menstrual period?"   n/a  Protocols used: [facility]

## 2018-01-08 NOTE — Telephone Encounter (Signed)
See call report/thx dmf

## 2018-01-08 NOTE — Patient Instructions (Addendum)
It was nice to meet you and I hope you feel better!  Avoid doxycycline and the cough syrup you were prescribed Increase your fluid intake, be sure to eat regular meals throughout the day (advance your diet from soup) If this occurs again you need to go to the ER immediately!  Do not drive yourself!   Syncope Syncope is when you lose temporarily pass out (faint). Signs that you may be about to pass out include:  Feeling dizzy or light-headed.  Feeling sick to your stomach (nauseous).  Seeing all white or all black.  Having cold, clammy skin.  If you passed out, get help right away. Call your local emergency services (911 in the [place] Do not drive yourself to the hospital. Follow these instructions at home: Pay attention to any changes in your symptoms. Take these actions to help with your condition:  Have someone stay with you until you feel stable.  Do not drive, use machinery, or play sports until your doctor says it is okay.  Keep all follow-up visits as told by your doctor. This is important.  If you start to feel like you might pass out, lie down right away and raise (elevate) your feet above the level of your heart. Breathe deeply and steadily. Wait until all of the symptoms are gone.  Drink enough fluid to keep your pee (urine) clear or pale yellow.  If you are taking blood pressure or heart medicine, get up slowly and spend many minutes getting ready to sit and then stand. This can help with dizziness.  Take over-the-counter and prescription medicines only as told by your doctor.  Get help right away if:  You have a very bad headache.  You have unusual pain in your chest, tummy, or back.  You are bleeding from your mouth or rectum.  You have black or tarry poop (stool).  You have a very fast or uneven heartbeat (palpitations).  It hurts to breathe.  You pass out once or more than once.  You have jerky movements that you cannot control (seizure).  You are  confused.  You have trouble walking.  You are very weak.  You have vision problems. These symptoms may be an emergency. Do not wait to see if the symptoms will go away. Get medical help right away. Call your local emergency services (911 in the [place] Do not drive yourself to the hospital. This information is not intended to replace advice given to you by your health care provider. Make sure you discuss any questions you have with your health care provider. Document Released: 03/17/2008 Document Revised: 03/06/2016 Document Reviewed: 06/13/2015 Elsevier Interactive Patient Education  [employer]2018 Elsevier Inc.

## 2018-01-08 NOTE — Progress Notes (Addendum)
   Subjective:    Patient ID: Kiara Haas, female    DOB: 1955-12-11, 62 y.o.   MRN: [id number]  Chief Complaint  Patient presents with  . Dizziness/Syncope   HPI Kiara Haas is a 62 y.o. female with a history of dystonia of the R hand and anxiety with depression here today after experiencing syncope x3.  Reports that she has had URI symptoms with congestion and cough for about 5 weeks.  Began having blood streaked nasal discharge a couple of days ago and was seen at an urgent care clinic.  She was prescribed doxycycline and bromfed DM cough syrup.  Reports that after each dose of doxycycline she experienced an episode where she passed out. She tells me that she often has reactions to medications she has not tried previously.   Reports "seeing spots" and having severe nausea prior to each episode.  Has injuries to chin and LLE.  Reports that cough has improved, still with some mild chest congestion.  Denies fever, chills, chest pain, palpitations,  dyspnea, headaches, vision change, nausea, vomiting, diarrhea.   She reports that other than L upper leg pain that she feels fine at this time.  She does request another antibiotic to complete course given prolonged upper respiratory symptoms.     Review of Systems ROS per HPI, otherwise negative.     Objective:   Physical Exam  Constitutional: She appears well-nourished. No distress.  HENT:  Nose: Nose normal.  Mouth/Throat: Oropharynx is clear and moist.  Bruising noted on chin  Eyes: Conjunctivae and EOM are normal. No scleral icterus.  Neck: Neck supple. No thyromegaly present.  Cardiovascular: Normal rate, regular rhythm and normal heart sounds.  Pulmonary/Chest: Effort normal and breath sounds normal.  Abdominal: Soft. She exhibits no distension. There is no tenderness.  Musculoskeletal:  Normal to inspection.  TTP along the lateral left upper leg.  FROM of hip and knee.  Able to bear weight and walk, although somewhat painful.      Lymphadenopathy:    She has no cervical adenopathy.  Neurological: She is alert. No cranial nerve deficit. Coordination normal.  Antalgic gait.   Psychiatric: She has a normal mood and affect. Her behavior is normal.          Assessment & Plan:  Syncope Syncopal episode x3 after taking doxycycline, will add to allergy list.  Orthostatics completed today and WNL although BP is a bit lower that prior readings.  Encouraged to increase fluid intake and switch to solid foods rather than just soup. EKG completed showing NSR and possible L atrial enlargement, no prior tracing available for comparison.  There is no ischemic signs and QT interval is normal.  Check CBC and BMP as well today.  Instructed that if this occurs again she is to go to the ER.    Acute URI She will d/c doxycycline, rx for azithromycin as she has tolerated this before and reports sensitivity to medications she has not tried before.

## 2018-01-08 NOTE — Assessment & Plan Note (Addendum)
Syncopal episode x3 after taking doxycycline, will add to allergy list.  Orthostatics completed today and WNL although BP is a bit lower that prior readings.  Encouraged to increase fluid intake and switch to solid foods rather than just soup. EKG completed showing NSR and possible L atrial enlargement, no prior tracing available for comparison.  There is no ischemic signs and QT interval is normal.  Check CBC and BMP as well today.  Instructed that if this occurs again she is to go to the ER.

## 2018-01-08 NOTE — Assessment & Plan Note (Signed)
She will d/c doxycycline, rx for azithromycin as she has tolerated this before and reports sensitivity to medications she has not tried before.

## 2018-02-16 ENCOUNTER — Other Ambulatory Visit: Payer: Self-pay | Admitting: Family Medicine

## 2018-02-16 NOTE — Telephone Encounter (Signed)
Copied from CRM [id number]. Topic: General - Other >> Feb 16, 2018 11:34 AM Elliot Gault wrote:  Relation to pt: self  Call back number:[phone] Pharmacy: [id number] - [place], [place] - [address] AT [facility] OF [phone] (Phone) [phone] (Fax)  Reason for call:  Patient aware she's not due but requesting a 3 month supply of sertraline (ZOLOFT) 50 MG tablet to hold her over until 05/03/18 physical appointment, patient aware please allow 48 to 72 hour turn around, please advise

## 2018-02-18 MED ORDER — SERTRALINE HCL 50 MG PO TABS: 50.0000 mg | ORAL_TABLET | Freq: Every day | ORAL | 2 refills | Status: DC

## 2018-02-18 NOTE — Telephone Encounter (Signed)
rx sent in 90 days supply per Dr. Dayton Martes. Pt to keep an appt in 04/2018 for more refills. Left detail message inform the pt.

## 2018-05-03 ENCOUNTER — Other Ambulatory Visit: Payer: Self-pay

## 2018-05-03 ENCOUNTER — Ambulatory Visit (INDEPENDENT_AMBULATORY_CARE_PROVIDER_SITE_OTHER): Payer: BLUE CROSS/BLUE SHIELD | Admitting: Family Medicine

## 2018-05-03 ENCOUNTER — Encounter: Payer: Self-pay | Admitting: Family Medicine

## 2018-05-03 VITALS — BP 126/86 | HR 65 | Temp 98.4°F | Ht 67.0 in | Wt 233.4 lb

## 2018-05-03 DIAGNOSIS — Z23 Encounter for immunization: Secondary | ICD-10-CM | POA: Diagnosis not present

## 2018-05-03 DIAGNOSIS — F32A Depression, unspecified: Secondary | ICD-10-CM

## 2018-05-03 DIAGNOSIS — Z0000 Encounter for general adult medical examination without abnormal findings: Secondary | ICD-10-CM | POA: Diagnosis not present

## 2018-05-03 DIAGNOSIS — F419 Anxiety disorder, unspecified: Secondary | ICD-10-CM

## 2018-05-03 DIAGNOSIS — F329 Major depressive disorder, single episode, unspecified: Secondary | ICD-10-CM

## 2018-05-03 DIAGNOSIS — Z13220 Encounter for screening for lipoid disorders: Secondary | ICD-10-CM

## 2018-05-03 LAB — COMPREHENSIVE METABOLIC PANEL
ALT: 19 U/L (ref 0–35)
AST: 17 U/L (ref 0–37)
Albumin: 4.6 g/dL (ref 3.5–5.2)
Alkaline Phosphatase: 64 U/L (ref 39–117)
BUN: 16 mg/dL (ref 6–23)
CHLORIDE: 104 meq/L (ref 96–112)
CO2: 22 meq/L (ref 19–32)
CREATININE: 0.76 mg/dL (ref 0.40–1.20)
Calcium: 9.4 mg/dL (ref 8.4–10.5)
GFR: 82.08 mL/min (ref >60.00)
GLUCOSE: 101 mg/dL — AB (ref 70–99)
POTASSIUM: 3.7 meq/L (ref 3.5–5.1)
SODIUM: 137 meq/L (ref 135–145)
Total Bilirubin: 0.7 mg/dL (ref 0.2–1.2)
Total Protein: 7.8 g/dL (ref 6.0–8.3)

## 2018-05-03 LAB — LIPID PANEL
CHOL/HDL RATIO: 4
Cholesterol: 234 mg/dL — ABNORMAL HIGH (ref 0–200)
HDL: 62.9 mg/dL (ref >39.00)
LDL CALC: 148 mg/dL — AB (ref 0–99)
NONHDL: 171.51
Triglycerides: 118 mg/dL (ref 0.0–149.0)
VLDL: 23.6 mg/dL (ref 0.0–40.0)

## 2018-05-03 MED ORDER — SERTRALINE HCL 50 MG PO TABS: 50.0000 mg | ORAL_TABLET | Freq: Every day | ORAL | 11 refills | Status: DC

## 2018-05-03 NOTE — Assessment & Plan Note (Signed)
Well controlled- see PHQ 9 and GAD screening. No changes made to rxs.

## 2018-05-03 NOTE — Progress Notes (Signed)
Subjective:   Patient ID: Kiara Haas, female    DOB: 1956/05/02, 62 y.o.   MRN: [id number]  Kiara Haas is a pleasant 62 y.o. year old female who presents to clinic today with Annual Exam (Patient is here today for a CPE.  Last Mammogram on 7.18.18 @ [facility] was WNL F/U in 1 year.  Last PAP 7.17.18 WNL next due 2021.  Had Colonoscopy @ [facility] by Dr. Mechele Collin 12.19.11 and to repeat in 10 years.  She agrees to get Tdap today. She is currently fasting.)  on 05/03/2018  HPI:  Health Maintenance  Topic Date Due  . MAMMOGRAM  04/29/2018  . INFLUENZA VACCINE  05/13/2018  . PAP SMEAR  04/28/2020  . COLONOSCOPY  05/24/2021  . TETANUS/TDAP  05/03/2028  . Hepatitis C Screening  Completed  . HIV Screening  Completed   Pap smear normal 04/28/17 (one by me). Colonoscopy 09/30/10 (told she was cleared for 10 year). Has appointment for mammogram scheduled for tomorrow.  Anxiety/depression- symptoms have been controlled on zoloft 50 mg daily. Denies any symptoms of anxiety or depression. Current Outpatient Medications on File Prior to Visit  Medication Sig Dispense Refill  . Calcium Carbonate (CALTRATE 600 PO) Take 600 mg by mouth daily.    . clonazePAM (KLONOPIN) 0.5 MG tablet Take 1 tablet (0.5 mg total) by mouth 2 (two) times daily. 60 tablet 5  . cyclobenzaprine (FLEXERIL) 10 MG tablet Take 10 mg by mouth as needed.     . sertraline (ZOLOFT) 50 MG tablet Take 1 tablet (50 mg total) by mouth daily. 30 tablet 2   No current facility-administered medications on file prior to visit.     Allergies  Allergen Reactions  . Cephalexin Other (See Comments)    Blisters in the mouth  . Paxil [Paroxetine Hcl]     Blisters in mouth and over body  . Cefuroxime Axetil Swelling  . Doxycycline Other (See Comments)    Syncope     Past Medical History:  Diagnosis Date  . Allergy   . Depression with anxiety   . Dystonia    hand  . Migraine   . Multiple  gastric ulcers   . Sciatica    bilateral  . Seizures (HCC)     Past Surgical History:  Procedure Laterality Date  . BREAST BIOPSY  1993  . EYE SURGERY     times 2 age 62 and 62 lazy eye  . polyps burned    . tonsils  1963    Family History  Problem Relation Age of Onset  . Diabetes Mother   . COPD Mother   . Arthritis Mother   . Arthritis Father   . Breast cancer Sister   . Breast cancer Maternal Aunt   . Breast cancer Paternal Aunt   . Pancreatic cancer Paternal Grandmother   . Heart disease Brother   . Diabetes Sister   . Diabetes Brother   . Heart disease Brother     Social History   Socioeconomic History  . Marital status: Married    Spouse name: Not on file  . Number of children: 0  . Years of education: 12+  . Highest education level: Not on file  Occupational History  . Not on file  Social Needs  . Financial resource strain: Not on file  . Food insecurity:    Worry: Not on file    Inability: Not on file  . Transportation  needs:    Medical: Not on file    Non-medical: Not on file  Tobacco Use  . Smoking status: Never Smoker  . Smokeless tobacco: Never Used  Substance and Sexual Activity  . Alcohol use: No  . Drug use: No  . Sexual activity: Never  Lifestyle  . Physical activity:    Days per week: Not on file    Minutes per session: Not on file  . Stress: Not on file  Relationships  . Social connections:    Talks on phone: Not on file    Gets together: Not on file    Attends religious service: Not on file    Active member of club or organization: Not on file    Attends meetings of clubs or organizations: Not on file    Relationship status: Not on file  . Intimate partner violence:    Fear of current or ex partner: Not on file    Emotionally abused: Not on file    Physically abused: Not on file    Forced sexual activity: Not on file  Other Topics Concern  . Not on file  Social History Narrative   Patient lives at home alone with 2 cats.     Patient has no children.    Patient is single.    Patient has 2 years of college.    The PMH, PSH, Social History, Family History, Medications, and allergies [facility], and have been updated if relevant.   Review of Systems  Constitutional: Negative.   HENT: Negative.   Eyes: Negative.   Respiratory: Negative.   Cardiovascular: Negative.   Gastrointestinal: Negative.   Endocrine: Negative.   Genitourinary: Negative.   Musculoskeletal: Negative.   Skin: Negative.   Neurological: Negative.   Hematological: Negative.   Psychiatric/Behavioral: Negative.   All other systems reviewed and are negative.      Objective:    BP 126/86 (BP Location: Left Arm, Patient Position: Sitting, Cuff Size: Normal)   Pulse 65   Temp 98.4 F (36.9 C) (Oral)   Ht 5\' 7"  (1.702 m)   Wt 233 lb 6.4 oz (105.9 kg)   SpO2 95%   BMI 36.56 kg/m    Physical Exam   General:  Well-developed,well-nourished,in no acute distress; alert,appropriate and cooperative throughout examination Head:  normocephalic and atraumatic.   Eyes:  vision grossly intact, PERRL Ears:  R ear normal and L ear normal externally, TMs clear bilaterally Nose:  no external deformity.   Mouth:  good dentition.   Neck:  No deformities, masses, or tenderness noted. Breasts:  No mass, nodules, thickening, tenderness, bulging, retraction, inflamation, nipple discharge or skin changes noted.   Lungs:  Normal respiratory effort, chest expands symmetrically. Lungs are clear to auscultation, no crackles or wheezes. Heart:  Normal rate and regular rhythm. S1 and S2 normal without gallop, murmur, click, rub or other extra sounds. Abdomen:  Bowel sounds positive,abdomen soft and non-tender without masses, organomegaly or hernias noted. Msk:  No deformity or scoliosis noted of thoracic or lumbar spine.   Extremities:  No clubbing, cyanosis, edema, or deformity noted with normal full range of motion of all joints.     Neurologic:  alert & oriented X3 and gait normal.   Skin:  Intact without suspicious lesions or rashes Cervical Nodes:  No lymphadenopathy noted Axillary Nodes:  No palpable lymphadenopathy Psych:  Cognition and judgment appear intact. Alert and cooperative with normal attention span and concentration. No apparent delusions, illusions,  hallucinations       Assessment & Plan:   Well woman exam without gynecological exam - Plan: CBC with Differential/Platelet, Comprehensive metabolic panel, Lipid panel  Need for Tdap vaccination - Plan: Tdap vaccine greater than or equal to 7yo IM  Anxiety and depression - Plan: CBC with Differential/Platelet, Comprehensive metabolic panel, TSH No follow-ups on file.

## 2018-05-03 NOTE — Patient Instructions (Signed)
Great to see you. I will call you with your lab results from today and you can view them online.   

## 2018-05-03 NOTE — Assessment & Plan Note (Signed)
Reviewed preventive care protocols, scheduled due services, and updated immunizations Discussed nutrition, exercise, diet, and healthy lifestyle.  Orders Placed This Encounter  Procedures  . Tdap vaccine greater than or equal to 62yo IM  . CBC with Differential/Platelet  . Comprehensive metabolic panel  . Lipid panel  . TSH    

## 2018-05-04 ENCOUNTER — Encounter: Payer: Self-pay | Admitting: Family Medicine

## 2018-05-04 ENCOUNTER — Telehealth: Payer: Self-pay | Admitting: Nurse Practitioner

## 2018-05-04 NOTE — Telephone Encounter (Signed)
Pt has called asking for her refill of her clonazePAM (KLONOPIN) 0.5 MG tablet to be sent to  [phone] [place] - [address] [facility] [address] [phone] (Phone) [phone] (Fax)     Pt 1 year f/u is scheduled for 10-28-2018

## 2018-05-05 MED ORDER — CLONAZEPAM 0.5 MG PO TABS: 0.5000 mg | ORAL_TABLET | Freq: Two times a day (BID) | ORAL | 5 refills | Status: DC

## 2018-05-05 NOTE — Telephone Encounter (Signed)
[facility] [place]

## 2018-05-05 NOTE — Addendum Note (Signed)
Addended by: Lynder ParentsMARTIN, NANCY C on: 05/05/2018 09:14 AM   Modules accepted: Orders

## 2018-10-27 NOTE — Progress Notes (Signed)
[facility]  PATIENT: Kiara Kiara Haas S Kiara Haas DOB: [birth year]   REASON FOR VISIT: Follow-up for abnormal involuntary movements, dystonia of the right upper extremity HISTORY FROM: Patient    HISTORY OF PRESENT ILLNESS:Ms. Kiara Kiara Haas, 63 year old female returns for yearly followup.  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  She can tell if she misses a dose.  She has resigned her job since last seen due to that difficulty performing her job.  She has not been able to find other work.  She is due to get Social Security soon.    She is on an antidepressant by her primary care. She has no new neurologic complaints. She returns for reevaluation.    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 When initially diagnosed her symptoms were variable, sometimes mild and sometimes interfering with her ability to write and perform fine function movements She has a remote history of epilepsy, she has had no seizure activity. She denies any difficulty writing or performing other tasks now, she has had no problems with her job. Hx of depression and been placed on any anti-depressant in recent years. No new neurologic complaints.    REVIEW OF SYSTEMS: Full 14 system review of systems performed and notable only for those listed, all others are neg:  Constitutional: neg  Cardiovascular: neg Ear/Nose/Throat: neg  Skin: neg Eyes: neg Respiratory: neg Gastroitestinal: neg  Hematology/Lymphatic: neg  Endocrine: neg Musculoskeletal:neg Allergy/Immunology: neg Neurological: neg Psychiatric: Depression Sleep : neg   ALLERGIES: Allergies  Allergen Reactions  . Cephalexin Other (See Comments)    Blisters in the mouth  . Paxil [Paroxetine Hcl]     Blisters in mouth and over body  . Cefuroxime Axetil Swelling  . Doxycycline Other (See Comments)   Syncope     HOME MEDICATIONS: Outpatient Medications Prior to Visit  Medication Sig Dispense Refill  . Calcium Carbonate (CALTRATE 600 PO) Take 600 mg by mouth daily.    . clonazePAM (KLONOPIN) 0.5 MG tablet Take 1 tablet (0.5 mg total) by mouth 2 (two) times daily. 60 tablet 5  . cyclobenzaprine (FLEXERIL) 10 MG tablet Take 10 mg by mouth as needed.     . sertraline (ZOLOFT) 50 MG tablet Take 1 tablet (50 mg total) by mouth daily. 30 tablet 11   No facility-administered medications prior to visit.     PAST MEDICAL HISTORY: Past Medical History:  Diagnosis Date  . Allergy   . Depression with anxiety   . Dystonia    hand  . Migraine   . Multiple gastric ulcers   . Sciatica    bilateral  . Seizures (HCC)     PAST SURGICAL HISTORY: Past Surgical History:  Procedure Laterality Date  . BREAST BIOPSY  1993  . EYE SURGERY     times 2 age 89 and [age] lazy eye  . polyps burned    . tonsils  1963    FAMILY HISTORY: Family History  Problem Relation Age of Onset  . Diabetes Mother   . COPD Mother   . Arthritis Mother   . Arthritis Father   . Breast cancer Sister   . Breast cancer Maternal Aunt   . Breast cancer Paternal Aunt   . Pancreatic cancer Paternal Grandmother   . Heart disease Brother   . Diabetes Sister   . Diabetes Brother   . Heart  disease Brother     SOCIAL HISTORY: Social History   Socioeconomic History  . Marital status: Married    Spouse name: Not on file  . Number of children: 0  . Years of education: 12+  . Highest education level: Not on file  Occupational History  . Not on file  Social Needs  . Financial resource strain: Not on file  . Food insecurity:    Worry: Not on file    Inability: Not on file  . Transportation needs:    Medical: Not on file    Non-medical: Not on file  Tobacco Use  . Smoking status: Never Smoker  . Smokeless tobacco: Never Used  Substance and Sexual Activity  . Alcohol use: No  . Drug use: No  . Sexual  activity: Never  Lifestyle  . Physical activity:    Days per week: Not on file    Minutes per session: Not on file  . Stress: Not on file  Relationships  . Social connections:    Talks on phone: Not on file    Gets together: Not on file    Attends religious service: Not on file    Active member of club or organization: Not on file    Attends meetings of clubs or organizations: Not on file    Relationship status: Not on file  . Intimate partner violence:    Fear of current or ex partner: Not on file    Emotionally abused: Not on file    Physically abused: Not on file    Forced sexual activity: Not on file  Other Topics Concern  . Not on file  Social History Narrative   Patient lives at home alone with 2 cats.    Patient has no children.    Patient is single.    Patient has 2 years of college.      PHYSICAL EXAM  Vitals:   10/28/18 1507  BP: 140/82  Pulse: (!) 59  Weight: 239 lb 9.6 oz (108.7 kg)  Height: 5\' 7"  (1.702 m)   Body mass index is 37.53 kg/m. Generalized: Well developed, obese female  no acute distress   Neurological examination   Mentation: Alert oriented to time, place, history taking. Follows all commands speech and language fluent Cranial nerve II-XII: Pupils were equal round reactive to light extraocular movements were full, visual field were full on confrontational test. Facial sensation and strength were normal. hearing was intact to finger rubbing bilaterally. Uvula tongue midline. head turning and shoulder shrug were normal and symmetric.Tongue protrusion into cheek strength was normal. Motor: normal bulk and tone, full strength in the BUE, BLE,  Coordination: finger-nose-finger, heel-to-shin bilaterally, no dysmetria Reflexes: Symmetric upper and lower, plantar responses were flexor bilaterally. Gait and Station: Rising up from seated position without assistance, normal stance, moderate stride, good arm swing, smooth turning, able to perform  tiptoe, and heel walking without difficulty. Tandem gait is steady   DIAGNOSTIC DATA (LABS, IMAGING, TESTING) - ASSESSMENT AND PLAN 63 y.o. year old female  has a past medical history of Depression with anxiety; Migraine; and Dystonia. here to follow-up for her right upper extremity dystonia.  Her symptoms are well controlled on clonazepam.  Continue clonazepam at current dose  Follow-up yearly and when necessary Nilda Riggs, [facility], [facility], APRN  [facility] [address] [place], [place] [address] [phone]

## 2018-10-28 ENCOUNTER — Ambulatory Visit: Payer: BLUE CROSS/BLUE SHIELD | Admitting: Nurse Practitioner

## 2018-10-28 ENCOUNTER — Encounter: Payer: Self-pay | Admitting: Nurse Practitioner

## 2018-10-28 DIAGNOSIS — G249 Dystonia, unspecified: Secondary | ICD-10-CM | POA: Diagnosis not present

## 2018-10-28 MED ORDER — CLONAZEPAM 0.5 MG PO TABS
0.5000 mg | ORAL_TABLET | Freq: Two times a day (BID) | ORAL | 5 refills | Status: DC
Start: 2018-10-28 — End: 2019-05-04

## 2018-10-28 NOTE — Patient Instructions (Signed)
Continue clonazepam at current dose  Follow-up yearly and when necessary

## 2018-10-28 NOTE — Progress Notes (Signed)
I reviewed note and agree with plan.   Jamarrius Salay R. Floyce Bujak, MD 10/28/2018, 7:26 PM Certified in Neurology, Neurophysiology and Neuroimaging  [facility] [address], [address] [place], [place] [address] [phone]   

## 2018-10-28 NOTE — Progress Notes (Signed)
Clonazepam Rx successfully faxed to [facility] in [place].

## 2018-11-04 ENCOUNTER — Telehealth: Payer: Self-pay | Admitting: Nurse Practitioner

## 2018-11-04 NOTE — Telephone Encounter (Signed)
Pt is asking for a call to discuss her refill on her clonazePAM (KLONOPIN) 0.5 MG tablet [facility] [phone]

## 2018-11-05 NOTE — Telephone Encounter (Signed)
Spoke to pt and she received her prescription which states no refills.  Should be 5.  I called her pharmacy and spoke to 3 people and the last person was Cheri Kearns, Teacher, early years/pre and she did see this error and would correct this.  I told pt I would call and take care of.

## 2019-01-03 ENCOUNTER — Telehealth: Payer: Self-pay | Admitting: Family Medicine

## 2019-01-03 NOTE — Telephone Encounter (Signed)
Copied from CRM [id number]. Topic: Quick Communication - See Telephone Encounter >> Jan 03, 2019 11:01 AM Jens Som A wrote: CRM for notification. See Telephone encounter for: 01/03/19.   Patient is calling because she very allergic to [facility]. She has used benadryl.  The spot is now spreading to her other arm. And she is requesting a script for prednisone. She does not know if there is something else because she thinks that the prednisone reducing her immuninity. Please advise CB- [phone]

## 2019-01-04 MED ORDER — PREDNISONE 20 MG PO TABS: ORAL_TABLET | ORAL | 0 refills | Status: DC

## 2019-01-04 MED ORDER — HYDROXYZINE HCL 25 MG PO TABS: 25.0000 mg | ORAL_TABLET | Freq: Four times a day (QID) | ORAL | 0 refills | Status: AC | PRN

## 2019-01-04 NOTE — Telephone Encounter (Signed)
Rx's sent in/pt is aware/thx dmf

## 2019-01-04 NOTE — Addendum Note (Signed)
Addended by: Lerry Liner on: 01/04/2019 01:52 PM   Modules accepted: Orders

## 2019-01-04 NOTE — Telephone Encounter (Signed)
TA-Pt is C/O contact dermatitis/exposed to PI 1-week-ago while doing yard work/she has used caladryl and Benadryl OTC/she is using Ivarest per the Pharmacist which that in combination with the Benadryl has worked best but she is so sleepy that she can't get anything done/Started on right wrist and then has spread down arm and now has spread to other arm/she is currently out of network or she would schedule a Tele--Visit but will still be coming for her Wellness in July regardless/has been Tx with Prednisone Taper in the past and would like to have this and then if she does not improve she agrees to be seen/I advised not to use oral and topical Benadryl at the same time  Plz advise if it is ok to send in Prednisone as previously prescribed in the past as well as Hydroxyzine 25mg  1q4-6h prn which may ease the itching and be slightly less sedating/[facility] [address], [place]  Per discussion with TA ok to do as stated above/thx dmf

## 2019-05-04 ENCOUNTER — Other Ambulatory Visit: Payer: Self-pay | Admitting: Diagnostic Neuroimaging

## 2019-05-04 MED ORDER — CLONAZEPAM 0.5 MG PO TABS: 0.5000 mg | ORAL_TABLET | Freq: Two times a day (BID) | ORAL | 1 refills | Status: DC

## 2019-05-04 NOTE — Telephone Encounter (Signed)
Pt called needing a refill on her clonazePAM (KLONOPIN) 0.5 MG tablet sent to the [facility] on [address] In [place] Pt states that the pharmacy has sent in the request.

## 2019-05-10 ENCOUNTER — Telehealth: Payer: Self-pay | Admitting: Family Medicine

## 2019-05-10 NOTE — Telephone Encounter (Signed)
Called and left vm for patient about appointment for 05/16/2019. I left a message saying that I will be canceling appointment for 05/16/2019 due to Dr. Deborra Medina not being in the office that week. Patient will need to call office back to schedule appt.

## 2019-05-11 NOTE — Telephone Encounter (Signed)
Called and left vm for patient to call back and r/s cancel appt from 05/16/2019 with Dr. Deborra Medina

## 2019-05-16 ENCOUNTER — Ambulatory Visit: Payer: BLUE CROSS/BLUE SHIELD | Admitting: Family Medicine

## 2019-05-23 ENCOUNTER — Other Ambulatory Visit: Payer: Self-pay | Admitting: Family Medicine

## 2019-05-29 ENCOUNTER — Encounter: Payer: Self-pay | Admitting: Family Medicine

## 2019-05-30 ENCOUNTER — Ambulatory Visit (INDEPENDENT_AMBULATORY_CARE_PROVIDER_SITE_OTHER): Payer: BLUE CROSS/BLUE SHIELD | Admitting: Family Medicine

## 2019-05-30 DIAGNOSIS — F32A Depression, unspecified: Secondary | ICD-10-CM

## 2019-05-30 DIAGNOSIS — F329 Major depressive disorder, single episode, unspecified: Secondary | ICD-10-CM | POA: Diagnosis not present

## 2019-05-30 DIAGNOSIS — F419 Anxiety disorder, unspecified: Secondary | ICD-10-CM | POA: Diagnosis not present

## 2019-05-30 MED ORDER — METAXALONE 800 MG PO TABS: 800.0000 mg | ORAL_TABLET | Freq: Three times a day (TID) | ORAL | 0 refills | Status: DC | PRN

## 2019-05-30 MED ORDER — SERTRALINE HCL 50 MG PO TABS: ORAL_TABLET | ORAL | 5 refills | Status: AC

## 2019-05-30 NOTE — Assessment & Plan Note (Signed)
>  22 minutes spent in face to face time with patient, >50% spent in counselling or coordination of care discussing anxiety and depression which is well controlled on zoloft 50 mg daily.  Both PHQ and GAD7 scores were zero today. No changes made to rxs- eRX refills sent.  GAD 7 : Generalized Anxiety Score 05/30/2019 05/03/2018  Nervous, Anxious, on Edge 0 0  Control/stop worrying 0 0  Worry too much - different things 0 0  Trouble relaxing 0 0  Restless 0 0  Easily annoyed or irritable 0 0  Afraid - awful might happen 0 0  Total GAD 7 Score 0 0  Anxiety Difficulty Not difficult at all Not difficult at all    Depression screen [facility] 2/9 05/30/2019 05/03/2018  Decreased Interest 0 0  Down, Depressed, Hopeless 0 0  PHQ - 2 Score 0 0  Altered sleeping 0 0  Tired, decreased energy 0 0  Change in appetite 0 0  Feeling bad or failure about yourself  0 0  Trouble concentrating 0 0  Moving slowly or fidgety/restless 0 0  Suicidal thoughts 0 0  PHQ-9 Score 0 0  Difficult doing work/chores Not difficult at all Not difficult at all

## 2019-05-30 NOTE — Progress Notes (Signed)
TELEPHONE ENCOUNTER   Patient verbally agreed to telephone visit and is aware that copayment and coinsurance may apply. Patient was treated using telemedicine according to accepted telemedicine protocols.  Location of the patient: Patient's home Location of provider: provider's office Names of all persons participating in the telemedicine service and role in the encounter: Arnette Norris, MD Leane Para  Subjective:   Chief Complaint  Patient presents with  . Follow-up    Pt agrees to tele-visit. For a F/U with Sertraline.      HPI   Anxiety/depression-  Symptoms well controlled on zoloft 50 mg daily. She is pleased with current dose.  When she retired at 20, it removed a lot of stress in her life.  GAD 7 : Generalized Anxiety Score 05/30/2019 05/03/2018  Nervous, Anxious, on Edge 0 0  Control/stop worrying 0 0  Worry too much - different things 0 0  Trouble relaxing 0 0  Restless 0 0  Easily annoyed or irritable 0 0  Afraid - awful might happen 0 0  Total GAD 7 Score 0 0  Anxiety Difficulty Not difficult at all Not difficult at all      Office Visit from 05/30/2019 in [facility]  PHQ-9 Total Score  0       Patient Active Problem List   Diagnosis Date Noted  . Dystonia 10/28/2018  . Anxiety and depression 12/05/2014   Social History   Tobacco Use  . Smoking status: Never Smoker  . Smokeless tobacco: Never Used  Substance Use Topics  . Alcohol use: No    Current Outpatient Medications:  .  Calcium Carbonate (CALTRATE 600 PO), Take 600 mg by mouth daily., Disp: , Rfl:  .  clonazePAM (KLONOPIN) 0.5 MG tablet, Take 1 tablet (0.5 mg total) by mouth 2 (two) times daily., Disp: 180 tablet, Rfl: 1 .  metaxalone (SKELAXIN) 800 MG tablet, Take 1 tablet (800 mg total) by mouth 3 (three) times daily as needed for muscle spasms., Disp: 30 tablet, Rfl: 0 .  sertraline (ZOLOFT) 50 MG tablet, TAKE 1 TABLET(50 MG) BY MOUTH DAILY, Disp: 30 tablet, Rfl: 5  Allergies  Allergen Reactions  . Cephalexin Other (See Comments)    Blisters in the mouth  . Paxil [Paroxetine Hcl]     Blisters in mouth and over body  . Cefuroxime Axetil Swelling  . Doxycycline Other (See Comments)    Syncope     Assessment & Plan:   1. Anxiety and depression     No orders of the defined types were placed in this encounter.  Meds ordered this encounter  Medications  . metaxalone (SKELAXIN) 800 MG tablet    Sig: Take 1 tablet (800 mg total) by mouth 3 (three) times daily as needed for muscle spasms.    Dispense:  30 tablet    Refill:  0  . sertraline (ZOLOFT) 50 MG tablet    Sig: TAKE 1 TABLET(50 MG) BY MOUTH DAILY    Dispense:  30 tablet    Refill:  5    Arnette Norris, MD 05/30/2019  Time spent with the patient: 22 minutes, spent in obtaining information about her symptoms, reviewing her previous labs, evaluations, and treatments, counseling her about her condition (please see the discussed topics above), and developing a plan to further investigate it; she had a number of questions which I addressed.   [id number] physician/qualified health professional telephone evaluation 5 to 10 minutes [id number] physician/qualified help functional [place] evaluation for 11 to  20 minutes [phone] physician/qualify he will professional telephone evaluation for 21 to 30 minutes

## 2019-11-02 NOTE — Progress Notes (Signed)
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  She says previously when she was initially diagnosed, she had dystonia in both hands, felt a sensation that both of her hands would flutter, she will have no control, would not be able to make her hands do what she wanted them to.  She has remained on clonazepam 0.5 mg tablet twice daily.  The medication works well for her symptoms.  She does notice that her fine motor skills such as painting her nails or painting wall trim isn't as precise.  She is retired.  She has been having some sciatica, completed physical therapy, may get MRI from her PCP.  She presents today for follow-up unaccompanied.  HISTORY 10/28/2018 CM: HISTORY OF PRESENT ILLNESS:Kiara Haas, 64 year old female returns for yearly followup.  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  She can tell if she misses a dose.  She has resigned her job since last seen due to that difficulty performing her job.  She has not been able to find other work.  She is due to get Social Security soon.    She is on an antidepressant by her primary care. She has no new neurologic complaints. She returns for reevaluation.    REVIEW OF SYSTEMS: Out of a complete 14 system review of symptoms, the patient complains only of the following symptoms, and all other reviewed systems are negative.  N/A  ALLERGIES: Allergies  Allergen Reactions  . Cephalexin Other (See Comments)    Blisters in the mouth  . Paxil [Paroxetine Hcl]     Blisters in mouth and over body  . Cefuroxime Axetil Swelling  . Doxycycline Other (See Comments)    Syncope     HOME MEDICATIONS: Outpatient Medications Prior to Visit  Medication Sig  Dispense Refill  . Calcium Carbonate (CALTRATE 600 PO) Take 600 mg by mouth daily.    . metaxalone (SKELAXIN) 800 MG tablet Take 1 tablet (800 mg total) by mouth 3 (three) times daily as needed for muscle spasms. 30 tablet 0  . sertraline (ZOLOFT) 50 MG tablet TAKE 1 TABLET(50 MG) BY MOUTH DAILY 30 tablet 5  . clonazePAM (KLONOPIN) 0.5 MG tablet Take 1 tablet (0.5 mg total) by mouth 2 (two) times daily. 180 tablet 1   No facility-administered medications prior to visit.    PAST MEDICAL HISTORY: Past Medical History:  Diagnosis Date  . Allergy   . Depression with anxiety   . Dystonia    hand  . Migraine   . Multiple gastric ulcers   . Sciatica    bilateral  . Seizures ([place])     PAST SURGICAL HISTORY: Past Surgical History:  Procedure Laterality Date  . BREAST BIOPSY  1993  . EYE SURGERY     times 2 age 68 and 26 lazy eye  . polyps burned    . tonsils  1963    FAMILY HISTORY: Family History  Problem Relation Age of Onset  . Diabetes Mother   . COPD Mother   . Arthritis Mother   . Arthritis Father   . Breast cancer Sister   . Breast cancer Maternal Aunt   . Breast cancer Paternal Aunt   . Pancreatic cancer Paternal  Grandmother   . Heart disease Brother   . Diabetes Sister   . Diabetes Brother   . Heart disease Brother     SOCIAL HISTORY: Social History   Socioeconomic History  . Marital status: Married    Spouse name: Not on file  . Number of children: 0  . Years of education: 12+  . Highest education level: Not on file  Occupational History  . Not on file  Tobacco Use  . Smoking status: Never Smoker  . Smokeless tobacco: Never Used  Substance and Sexual Activity  . Alcohol use: No  . Drug use: No  . Sexual activity: Never  Other Topics Concern  . Not on file  Social History Narrative   Patient lives at home alone with 2 cats.    Patient has no children.    Patient is single.    Patient has 2 years of college.    Social Determinants of Health    Financial Resource Strain:   . Difficulty of Paying Living Expenses: Not on file  Food Insecurity:   . Worried About Programme researcher, broadcasting/film/video in the Last Year: Not on file  . Ran Out of Food in the Last Year: Not on file  Transportation Needs:   . Lack of Transportation (Medical): Not on file  . Lack of Transportation (Non-Medical): Not on file  Physical Activity:   . Days of Exercise per Week: Not on file  . Minutes of Exercise per Session: Not on file  Stress:   . Feeling of Stress : Not on file  Social Connections:   . Frequency of Communication with Friends and Family: Not on file  . Frequency of Social Gatherings with Friends and Family: Not on file  . Attends Religious Services: Not on file  . Active Member of Clubs or Organizations: Not on file  . Attends Banker Meetings: Not on file  . Marital Status: Not on file  Intimate Partner Violence:   . Fear of Current or Ex-Partner: Not on file  . Emotionally Abused: Not on file  . Physically Abused: Not on file  . Sexually Abused: Not on file      PHYSICAL EXAM  Vitals:   11/03/19 1506  BP: 138/79  Pulse: 72  Temp: (!) 97.2 F (36.2 C)  TempSrc: Oral  Weight: 229 lb 12.8 oz (104.2 kg)  Height: 5\' 7"  (1.702 m)   Body mass index is 35.99 kg/m.  Generalized: Well developed, in no acute distress   Neurological examination  Mentation: Alert oriented to time, place, history taking. Follows all commands speech and language fluent Cranial nerve II-XII: Pupils were equal round reactive to light. Extraocular movements were full, visual field were full on confrontational test. Facial sensation and strength were normal.  Head turning and shoulder shrug  were normal and symmetric. Motor: The motor testing reveals 5 over 5 strength of all 4 extremities. Good symmetric motor tone is noted throughout.  Sensory: Sensory testing is intact to soft touch on all 4 extremities. No evidence of extinction is noted.   Coordination: Cerebellar testing reveals good finger-nose-finger and heel-to-shin bilaterally.  Gait and station: Gait is normal. Tandem gait is normal.  Reflexes: Deep tendon reflexes are symmetric and normal bilaterally.   DIAGNOSTIC DATA (LABS, IMAGING, TESTING) - I reviewed patient records, labs, notes, testing and imaging myself where available.  Lab Results  Component Value Date   WBC 7.7 01/08/2018   HGB 13.8 01/08/2018   HCT 39.7  01/08/2018   MCV 83.8 01/08/2018   PLT 210 01/08/2018      Component Value Date/Time   NA 137 05/03/2018 1439   K 3.7 05/03/2018 1439   CL 104 05/03/2018 1439   CO2 22 05/03/2018 1439   GLUCOSE 101 (H) 05/03/2018 1439   BUN 16 05/03/2018 1439   CREATININE 0.76 05/03/2018 1439   CREATININE 0.77 01/08/2018 1653   CALCIUM 9.4 05/03/2018 1439   PROT 7.8 05/03/2018 1439   ALBUMIN 4.6 05/03/2018 1439   AST 17 05/03/2018 1439   ALT 19 05/03/2018 1439   ALKPHOS 64 05/03/2018 1439   BILITOT 0.7 05/03/2018 1439   Lab Results  Component Value Date   CHOL 234 (H) 05/03/2018   HDL 62.90 05/03/2018   LDLCALC 148 (H) 05/03/2018   TRIG 118.0 05/03/2018   CHOLHDL 4 05/03/2018   No results found for: HGBA1C No results found for: VITAMINB12 Lab Results  Component Value Date   TSH 1.37 04/28/2017   ASSESSMENT AND PLAN 64 y.o. year old female  has a past medical history of Allergy, Depression with anxiety, Dystonia, Migraine, Multiple gastric ulcers, Sciatica, and Seizures (HCC). here with:  1.  Dystonia of her upper extremities  She has continued to remain stable, while taking clonazepam, which controls her symptoms well.  She will remain on clonazepam 0.5 mg tablet twice daily.  I will refill the medication today, last fill was 08/09/2019 180 tablets. I have sent in 60 tablets, with 5 refills.  She will follow-up in 1 year or sooner if needed.   I spent 15 minutes with the patient. 50% of this time was spent discussing her plan of care.    Margie Ege, AGNP-C, DNP 11/03/2019, 3:35 PM [facility] [address], [address] [place], [place] [address] [phone]

## 2019-11-03 ENCOUNTER — Other Ambulatory Visit: Payer: Self-pay

## 2019-11-03 ENCOUNTER — Ambulatory Visit (INDEPENDENT_AMBULATORY_CARE_PROVIDER_SITE_OTHER): Payer: 59 | Admitting: Neurology

## 2019-11-03 ENCOUNTER — Encounter: Payer: Self-pay | Admitting: Neurology

## 2019-11-03 VITALS — BP 138/79 | HR 72 | Temp 97.2°F | Ht 67.0 in | Wt 229.8 lb

## 2019-11-03 DIAGNOSIS — G249 Dystonia, unspecified: Secondary | ICD-10-CM

## 2019-11-03 MED ORDER — CLONAZEPAM 0.5 MG PO TABS: 0.5000 mg | ORAL_TABLET | Freq: Two times a day (BID) | ORAL | 5 refills | Status: DC

## 2019-11-03 NOTE — Patient Instructions (Signed)
I am glad you are doing well, continue taking clonazepam as prescribed. We will see you in 1 year.

## 2019-11-09 ENCOUNTER — Encounter: Payer: BLUE CROSS/BLUE SHIELD | Admitting: Family Medicine

## 2019-11-14 NOTE — Progress Notes (Signed)
I reviewed note and agree with plan.   Suanne Marker, MD 11/14/2019, 9:57 AM Certified in Neurology, Neurophysiology and Neuroimaging  [facility] [address] [place], [place] [address] [phone]

## 2019-12-20 ENCOUNTER — Other Ambulatory Visit: Payer: Self-pay | Admitting: Orthopedic Surgery

## 2019-12-20 DIAGNOSIS — M5442 Lumbago with sciatica, left side: Secondary | ICD-10-CM

## 2019-12-20 DIAGNOSIS — M4727 Other spondylosis with radiculopathy, lumbosacral region: Secondary | ICD-10-CM

## 2019-12-20 DIAGNOSIS — M4807 Spinal stenosis, lumbosacral region: Secondary | ICD-10-CM

## 2019-12-29 ENCOUNTER — Other Ambulatory Visit: Payer: Self-pay

## 2019-12-29 ENCOUNTER — Ambulatory Visit
Admission: RE | Admit: 2019-12-29 | Discharge: 2019-12-29 | Disposition: A | Discharge status: Home / Self Care | Payer: 59 | Source: Ambulatory Visit | Attending: Orthopedic Surgery | Admitting: Orthopedic Surgery

## 2019-12-29 DIAGNOSIS — M5442 Lumbago with sciatica, left side: Secondary | ICD-10-CM | POA: Insufficient documentation

## 2019-12-29 DIAGNOSIS — M4727 Other spondylosis with radiculopathy, lumbosacral region: Secondary | ICD-10-CM | POA: Insufficient documentation

## 2019-12-29 DIAGNOSIS — M4807 Spinal stenosis, lumbosacral region: Secondary | ICD-10-CM | POA: Insufficient documentation

## 2020-01-19 ENCOUNTER — Other Ambulatory Visit: Payer: Self-pay | Admitting: Physician Assistant

## 2020-01-19 DIAGNOSIS — Z1231 Encounter for screening mammogram for malignant neoplasm of breast: Secondary | ICD-10-CM

## 2020-02-27 ENCOUNTER — Ambulatory Visit
Admission: RE | Admit: 2020-02-27 | Discharge: 2020-02-27 | Disposition: A | Discharge status: Home / Self Care | Payer: 59 | Source: Ambulatory Visit | Attending: Physician Assistant | Admitting: Physician Assistant

## 2020-02-27 DIAGNOSIS — Z1231 Encounter for screening mammogram for malignant neoplasm of breast: Secondary | ICD-10-CM | POA: Diagnosis not present

## 2020-03-02 ENCOUNTER — Other Ambulatory Visit: Payer: Self-pay | Admitting: *Deleted

## 2020-03-02 ENCOUNTER — Inpatient Hospital Stay
Admission: RE | Admit: 2020-03-02 | Discharge: 2020-03-02 | Disposition: A | Discharge status: Home / Self Care | Payer: Self-pay | Source: Ambulatory Visit | Attending: *Deleted | Admitting: *Deleted

## 2020-03-02 DIAGNOSIS — Z1231 Encounter for screening mammogram for malignant neoplasm of breast: Secondary | ICD-10-CM

## 2020-05-09 ENCOUNTER — Other Ambulatory Visit: Payer: Self-pay | Admitting: *Deleted

## 2020-05-09 MED ORDER — CLONAZEPAM 0.5 MG PO TABS: 0.5000 mg | ORAL_TABLET | Freq: Two times a day (BID) | ORAL | 5 refills | Status: DC

## 2020-11-06 ENCOUNTER — Ambulatory Visit: Payer: 59 | Admitting: Neurology

## 2020-11-06 ENCOUNTER — Encounter: Payer: Self-pay | Admitting: Neurology

## 2020-11-06 VITALS — BP 126/70 | HR 70 | Ht 67.0 in | Wt 236.0 lb

## 2020-11-06 DIAGNOSIS — G249 Dystonia, unspecified: Secondary | ICD-10-CM

## 2020-11-06 MED ORDER — CLONAZEPAM 0.5 MG PO TABS: 0.5000 mg | ORAL_TABLET | Freq: Two times a day (BID) | ORAL | 5 refills | Status: DC

## 2020-11-06 NOTE — Progress Notes (Signed)
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  Well-controlled with clonazepam 0.5 twice daily.  She notices if she misses 2 doses of the medication, feels fluttering in her hands.  Overall, notices her fine motor skills such as painting a wall are not as precise, otherwise full function of the hands. She has no problems with handwriting.  She is retired.  Is receiving injections to the low back at the [facility], 2 rounds so far with good benefit.  Here today for evaluation unaccompanied.  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  She says previously when she was initially diagnosed, she had dystonia in both hands, felt a sensation that both of her hands would flutter, she will have no control, would not be able to make her hands do what she wanted them to.  She has remained on clonazepam 0.5 mg tablet twice daily.  The medication works well for her symptoms.  She does notice that her fine motor skills such as painting her nails or painting wall trim isn't as precise.  She is retired.  She has been having some sciatica, completed physical therapy, may get MRI from her PCP.  She presents today for follow-up unaccompanied.   REVIEW OF SYSTEMS: Out of a complete 14 system review of symptoms, the patient complains only of the following symptoms, and all other reviewed systems are negative.  n/a  ALLERGIES: Allergies  Allergen Reactions  . Cephalexin Other (See Comments)    Blisters in the mouth  . Paxil [Paroxetine Hcl]     Blisters in mouth and over body  . Cefuroxime Axetil Swelling  . Doxycycline Other (See Comments)    Syncope     HOME  MEDICATIONS: Outpatient Medications Prior to Visit  Medication Sig Dispense Refill  . Calcium Carbonate (CALTRATE 600 PO) Take 600 mg by mouth daily.    . sertraline (ZOLOFT) 50 MG tablet TAKE 1 TABLET(50 MG) BY MOUTH DAILY 30 tablet 5  . clonazePAM (KLONOPIN) 0.5 MG tablet Take 1 tablet (0.5 mg total) by mouth 2 (two) times daily. 60 tablet 5  . metaxalone (SKELAXIN) 800 MG tablet Take 1 tablet (800 mg total) by mouth 3 (three) times daily as needed for muscle spasms. 30 tablet 0   No facility-administered medications prior to visit.    PAST MEDICAL HISTORY: Past Medical History:  Diagnosis Date  . Allergy   . Depression with anxiety   . Dystonia    hand  . Migraine   . Multiple gastric ulcers   . Sciatica    bilateral  . Seizures (HCC)     PAST SURGICAL HISTORY: Past Surgical History:  Procedure Laterality Date  . BREAST BIOPSY  1993  . BREAST EXCISIONAL BIOPSY    . EYE SURGERY     times 2 age 35 and 67 lazy eye  . polyps burned    . tonsils  1963    FAMILY HISTORY: Family History  Problem Relation Age of Onset  . Diabetes Mother   . COPD Mother   . Arthritis Mother   . Arthritis Father   . Breast cancer Sister   . Breast cancer Maternal Aunt   . Breast  cancer Paternal Aunt   . Pancreatic cancer Paternal Grandmother   . Heart disease Brother   . Diabetes Sister   . Diabetes Brother   . Heart disease Brother     SOCIAL HISTORY: Social History   Socioeconomic History  . Marital status: Divorced    Spouse name: Not on file  . Number of children: 0  . Years of education: 12+  . Highest education level: Not on file  Occupational History  . Not on file  Tobacco Use  . Smoking status: Never Smoker  . Smokeless tobacco: Never Used  Substance and Sexual Activity  . Alcohol use: No  . Drug use: No  . Sexual activity: Never  Other Topics Concern  . Not on file  Social History Narrative   Patient lives at home alone with 2 cats.    Patient has no  children.    Patient is single.    Patient has 2 years of college.    Social Determinants of Health   Financial Resource Strain: Not on file  Food Insecurity: Not on file  Transportation Needs: Not on file  Physical Activity: Not on file  Stress: Not on file  Social Connections: Not on file  Intimate Partner Violence: Not on file   PHYSICAL EXAM  Vitals:   11/06/20 1457  BP: 126/70  Pulse: 70  Weight: 236 lb (107 kg)  Height: 5\' 7"  (1.702 m)   Body mass index is 36.96 kg/m.  Generalized: Well developed, in no acute distress   Neurological examination  Mentation: Alert oriented to time, place, history taking. Follows all commands speech and language fluent Cranial nerve II-XII: Pupils were equal round reactive to light. Extraocular movements were full, visual field were full on confrontational test. Facial sensation and strength were normal. Head turning and shoulder shrug  were normal and symmetric. Motor: The motor testing reveals 5 over 5 strength of all 4 extremities. Good symmetric motor tone is noted throughout.  Sensory: Sensory testing is intact to soft touch on all 4 extremities. No evidence of extinction is noted.  Coordination: Cerebellar testing reveals good finger-nose-finger and heel-to-shin bilaterally.  Gait and station: Gait is normal.  Reflexes: Deep tendon reflexes are symmetric and normal bilaterally.   DIAGNOSTIC DATA (LABS, IMAGING, TESTING) - I reviewed patient records, labs, notes, testing and imaging myself where available.  Lab Results  Component Value Date   WBC 7.7 01/08/2018   HGB 13.8 01/08/2018   HCT 39.7 01/08/2018   MCV 83.8 01/08/2018   PLT 210 01/08/2018      Component Value Date/Time   NA 137 05/03/2018 1439   K 3.7 05/03/2018 1439   CL 104 05/03/2018 1439   CO2 22 05/03/2018 1439   GLUCOSE 101 (H) 05/03/2018 1439   BUN 16 05/03/2018 1439   CREATININE 0.76 05/03/2018 1439   CREATININE 0.77 01/08/2018 1653   CALCIUM 9.4  05/03/2018 1439   PROT 7.8 05/03/2018 1439   ALBUMIN 4.6 05/03/2018 1439   AST 17 05/03/2018 1439   ALT 19 05/03/2018 1439   ALKPHOS 64 05/03/2018 1439   BILITOT 0.7 05/03/2018 1439   Lab Results  Component Value Date   CHOL 234 (H) 05/03/2018   HDL 62.90 05/03/2018   LDLCALC 148 (H) 05/03/2018   TRIG 118.0 05/03/2018   CHOLHDL 4 05/03/2018   No results found for: HGBA1C No results found for: VITAMINB12 Lab Results  Component Value Date   TSH 1.37 04/28/2017   ASSESSMENT AND PLAN 64  y.o. year old female  has a past medical history of Allergy, Depression with anxiety, Dystonia, Migraine, Multiple gastric ulcers, Sciatica, and Seizures (HCC). here with:  1.  Dystonia of upper extremities -Has remained overall stable -Continue clonazepam 0.5 mg twice a day -Refill sent in, last fill was 10/10/20 # 60 -Follow-up in 1 year or sooner if needed  I spent 20 minutes of face-to-face and non-face-to-face time with patient.  This included previsit chart review, lab review, study review, order entry, electronic health record documentation, patient education.  Margie Ege, AGNP-C, DNP 11/06/2020, 3:44 PM [facility] [address] [place], [place] [address] [phone]

## 2020-11-06 NOTE — Patient Instructions (Signed)
Continue Klonopin  Refill sent in  See you back in 1 year

## 2020-11-19 NOTE — Progress Notes (Signed)
I reviewed note and agree with plan.   Adell Panek R. Virginio Isidore, MD 11/19/2020, 3:45 PM Certified in Neurology, Neurophysiology and Neuroimaging  [facility] [address], [address] [place], [place] [address] [phone]  

## 2021-01-23 ENCOUNTER — Other Ambulatory Visit: Payer: Self-pay | Admitting: Physician Assistant

## 2021-01-23 DIAGNOSIS — Z1231 Encounter for screening mammogram for malignant neoplasm of breast: Secondary | ICD-10-CM

## 2021-02-27 ENCOUNTER — Other Ambulatory Visit: Payer: Self-pay

## 2021-02-27 ENCOUNTER — Ambulatory Visit
Admission: RE | Admit: 2021-02-27 | Discharge: 2021-02-27 | Disposition: A | Discharge status: Home / Self Care | Payer: 59 | Source: Ambulatory Visit | Attending: Physician Assistant | Admitting: Physician Assistant

## 2021-02-27 DIAGNOSIS — Z1231 Encounter for screening mammogram for malignant neoplasm of breast: Secondary | ICD-10-CM | POA: Insufficient documentation

## 2021-05-08 ENCOUNTER — Other Ambulatory Visit: Payer: Self-pay | Admitting: *Deleted

## 2021-05-08 MED ORDER — CLONAZEPAM 0.5 MG PO TABS: 0.5000 mg | ORAL_TABLET | Freq: Two times a day (BID) | ORAL | 5 refills | Status: DC

## 2021-05-21 ENCOUNTER — Encounter: Payer: Self-pay | Admitting: General Surgery

## 2021-05-22 ENCOUNTER — Ambulatory Visit: Payer: 59 | Admitting: Certified Registered Nurse Anesthetist

## 2021-05-22 ENCOUNTER — Encounter
Admission: RE | Disposition: A | Discharge status: Home / Self Care | Payer: Self-pay | Source: Home / Self Care | Attending: General Surgery

## 2021-05-22 ENCOUNTER — Ambulatory Visit
Admission: RE | Admit: 2021-05-22 | Discharge: 2021-05-22 | Disposition: A | Discharge status: Home / Self Care | Payer: 59 | Attending: General Surgery | Admitting: General Surgery

## 2021-05-22 DIAGNOSIS — Z1211 Encounter for screening for malignant neoplasm of colon: Secondary | ICD-10-CM | POA: Insufficient documentation

## 2021-05-22 DIAGNOSIS — Z791 Long term (current) use of non-steroidal anti-inflammatories (NSAID): Secondary | ICD-10-CM | POA: Insufficient documentation

## 2021-05-22 DIAGNOSIS — Z79899 Other long term (current) drug therapy: Secondary | ICD-10-CM | POA: Diagnosis not present

## 2021-05-22 DIAGNOSIS — Z881 Allergy status to other antibiotic agents status: Secondary | ICD-10-CM | POA: Insufficient documentation

## 2021-05-22 DIAGNOSIS — Z888 Allergy status to other drugs, medicaments and biological substances status: Secondary | ICD-10-CM | POA: Diagnosis not present

## 2021-05-22 HISTORY — DX: Spinal stenosis, lumbar region without neurogenic claudication: M48.061

## 2021-05-22 HISTORY — PX: COLONOSCOPY WITH PROPOFOL: SHX5780

## 2021-05-22 HISTORY — DX: Prediabetes: R73.03

## 2021-05-22 HISTORY — DX: Lumbago with sciatica, unspecified side: M54.40

## 2021-05-22 HISTORY — DX: Essential (primary) hypertension: I10

## 2021-05-22 SURGERY — COLONOSCOPY WITH PROPOFOL
Anesthesia: General

## 2021-05-22 MED ORDER — LIDOCAINE HCL (PF) 2 % IJ SOLN
INTRAMUSCULAR | Status: AC
  Filled 2021-05-22: qty 5

## 2021-05-22 MED ORDER — PROPOFOL 10 MG/ML IV BOLUS
INTRAVENOUS | Status: DC | PRN
  Administered 2021-05-22: 70 mg via INTRAVENOUS

## 2021-05-22 MED ORDER — LIDOCAINE HCL (PF) 2 % IJ SOLN
INTRAMUSCULAR | Status: AC
  Filled 2021-05-22: qty 10

## 2021-05-22 MED ORDER — GLYCOPYRROLATE 0.2 MG/ML IJ SOLN
INTRAMUSCULAR | Status: AC
  Filled 2021-05-22: qty 1

## 2021-05-22 MED ORDER — SODIUM CHLORIDE 0.9 % IV SOLN
INTRAVENOUS | Status: DC
  Administered 2021-05-22: 20 mL/h via INTRAVENOUS

## 2021-05-22 MED ORDER — PROPOFOL 500 MG/50ML IV EMUL
INTRAVENOUS | Status: AC
  Filled 2021-05-22: qty 300

## 2021-05-22 MED ORDER — PHENYLEPHRINE HCL (PRESSORS) 10 MG/ML IV SOLN
INTRAVENOUS | Status: AC
  Filled 2021-05-22: qty 1

## 2021-05-22 MED ORDER — PROPOFOL 500 MG/50ML IV EMUL
INTRAVENOUS | Status: DC | PRN
  Administered 2021-05-22: 160 ug/kg/min via INTRAVENOUS

## 2021-05-22 NOTE — Transfer of Care (Signed)
Immediate Anesthesia Transfer of Care Note  Patient: Kiara Haas  Procedure(s) Performed: COLONOSCOPY WITH PROPOFOL  Patient Location: PACU  Anesthesia Type:General  Level of Consciousness: drowsy  Airway & Oxygen Therapy: Patient Spontanous Breathing and Patient connected to nasal cannula oxygen  Post-op Assessment: Report given to RN and Post -op Vital signs reviewed and stable  Post vital signs: Reviewed and stable  Last Vitals:  Vitals Value Taken Time  BP 122/69 05/22/21 0755  Temp    Pulse 66 05/22/21 0755  Resp 18 05/22/21 0755  SpO2 96 % 05/22/21 0755  Vitals shown include unvalidated device data.  Last Pain:  Vitals:   05/22/21 0650  TempSrc: Temporal  PainSc: 5          Complications: No notable events documented.

## 2021-05-22 NOTE — Anesthesia Postprocedure Evaluation (Signed)
Anesthesia Post Note  Patient: Kiara Haas  Procedure(s) Performed: COLONOSCOPY WITH PROPOFOL  Patient location during evaluation: Endoscopy Anesthesia Type: General Level of consciousness: awake and alert and oriented Pain management: pain level controlled Vital Signs Assessment: post-procedure vital signs reviewed and stable Respiratory status: spontaneous breathing, nonlabored ventilation and respiratory function stable Cardiovascular status: blood pressure returned to baseline and stable Postop Assessment: no signs of nausea or vomiting Anesthetic complications: no   No notable events documented.   Last Vitals:  Vitals:   05/22/21 0815 05/22/21 0825  BP: 129/79 123/68  Pulse: (!) 53 (!) 57  Resp: 13 12  Temp:    SpO2: 100% 100%    Last Pain:  Vitals:   05/22/21 0825  TempSrc:   PainSc: 0-No pain                 Jalexa Pifer

## 2021-05-22 NOTE — H&P (Signed)
Kiara Haas [id number] 03-Jan-1956     HPI: 65 y/o woman for a screening colonoscopy. Tolerated prep well.   Medications Prior to Admission  Medication Sig Dispense Refill Last Dose   Calcium Carbonate (CALTRATE 600 PO) Take 600 mg by mouth daily.   Past Week   clonazePAM (KLONOPIN) 0.5 MG tablet Take 1 tablet (0.5 mg total) by mouth 2 (two) times daily. 60 tablet 5 Past Week   meloxicam (MOBIC) 15 MG tablet Take 15 mg by mouth daily.   Past Week   oxyCODONE-acetaminophen (PERCOCET/ROXICET) 5-325 MG tablet Take 1 tablet by mouth every 4 (four) hours as needed for severe pain.   Past Week   sertraline (ZOLOFT) 50 MG tablet TAKE 1 TABLET(50 MG) BY MOUTH DAILY 30 tablet 5 05/21/2021   Allergies  Allergen Reactions   Cephalexin Other (See Comments)    Blisters in the mouth   Paxil [Paroxetine Hcl]     Blisters in mouth and over body   Cefuroxime Axetil Swelling   Doxycycline Other (See Comments)    Syncope    Past Medical History:  Diagnosis Date   Allergy    Depression with anxiety    Dystonia    hand   Hypertension    Low back pain of thoracolumbar region with sciatica    Lumbar stenosis without neurogenic claudication    Migraine    Multiple gastric ulcers    Pre-diabetes    Sciatica    bilateral   Seizures (HCC)    Past Surgical History:  Procedure Laterality Date   BREAST BIOPSY  1993   BREAST EXCISIONAL BIOPSY     EYE SURGERY     times 2 age 44 and 14 lazy eye   polyps burned     tonsils  1963   Social History   Socioeconomic History   Marital status: Divorced    Spouse name: Not on file   Number of children: 0   Years of education: 12+   Highest education level: Not on file  Occupational History   Not on file  Tobacco Use   Smoking status: Never   Smokeless tobacco: Never  Vaping Use   Vaping Use: Never used  Substance and Sexual Activity   Alcohol use: No   Drug use: No   Sexual activity: Never  Other Topics Concern   Not on file  Social  History Narrative   Patient lives at home alone with 2 cats.    Patient has no children.    Patient is single.    Patient has 2 years of college.    Social Determinants of Health   Financial Resource Strain: Not on file  Food Insecurity: Not on file  Transportation Needs: Not on file  Physical Activity: Not on file  Stress: Not on file  Social Connections: Not on file  Intimate Partner Violence: Not on file   Social History   Social History Narrative   Patient lives at home alone with 2 cats.    Patient has no children.    Patient is single.    Patient has 2 years of college.      ROS: Negative.     PE: HEENT: Negative. Lungs: Clear. Cardio: RR. Assessment/Plan:  Proceed with planned endoscopy.   Merrily Pew [facility] 05/22/2021

## 2021-05-22 NOTE — Anesthesia Procedure Notes (Signed)
Date/Time: 05/22/2021 7:34 AM Performed by: Malva Cogan, CRNA Pre-anesthesia Checklist: Patient identified, Emergency Drugs available, Suction available, Patient being monitored and Timeout performed Oxygen Delivery Method: Nasal cannula Induction Type: IV induction Placement Confirmation: CO2 detector and positive ETCO2

## 2021-05-22 NOTE — Anesthesia Preprocedure Evaluation (Signed)
Anesthesia Evaluation  Patient identified by MRN, date of birth, ID band Patient awake    Reviewed: Allergy & Precautions, NPO status , Patient's Chart, lab work & pertinent test results  History of Anesthesia Complications Negative for: history of anesthetic complications  Airway Mallampati: II  TM Distance: >3 FB Neck ROM: Full    Dental no notable dental hx.    Pulmonary neg pulmonary ROS, neg sleep apnea, neg COPD,    breath sounds clear to auscultation- rhonchi (-) wheezing      Cardiovascular Exercise Tolerance: Good hypertension, Pt. 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  PSYCHIATRIC DISORDERS Anxiety Depression    GI/Hepatic Neg liver ROS, PUD,   Endo/Other  negative endocrine ROSneg diabetes  Renal/GU negative Renal ROS     Musculoskeletal negative musculoskeletal ROS (+)   Abdominal (+) + obese,   Peds  Hematology negative hematology ROS (+)   Anesthesia Other Findings Past Medical History: No date: Allergy No date: Depression with anxiety No date: Dystonia     Comment:  hand No date: Hypertension No date: Low back pain of thoracolumbar region with sciatica No date: Lumbar stenosis without neurogenic claudication No date: Migraine No date: Multiple gastric ulcers No date: Pre-diabetes No date: Sciatica     Comment:  bilateral No date: Seizures (HCC)   Reproductive/Obstetrics                             Anesthesia Physical Anesthesia Plan  ASA: 2  Anesthesia Plan: General   Post-op Pain Management:    Induction: Intravenous  PONV Risk Score and Plan: 2 and Propofol infusion  Airway Management Planned: Natural Airway  Additional  Equipment:   Intra-op Plan:   Post-operative Plan:   Informed Consent: I have reviewed the patients History and Physical, chart, labs and discussed the procedure including the risks, benefits and alternatives for the proposed anesthesia with the patient or authorized representative who has indicated his/her understanding and acceptance.     Dental advisory given  Plan Discussed with: CRNA and Anesthesiologist  Anesthesia Plan Comments:         Anesthesia Quick Evaluation

## 2021-05-22 NOTE — Op Note (Signed)
[facility] Gastroenterology Patient Name: Kiara Haas Procedure Date: 05/22/2021 6:57 AM MRN: [id number] Account #: [id number] Date of Birth: 06-23-1956 Admit Type: Outpatient Age: 65 Room: [facility] ENDO ROOM 1 Gender: Female Note Status: Finalized Procedure:             Colonoscopy Indications:           Screening for colorectal malignant neoplasm Providers:             Earline Mayotte, MD Referring MD:          Marilynne Halsted, MD (Referring MD) Medicines:             Propofol per Anesthesia Complications:         No immediate complications. Procedure:             Pre-Anesthesia Assessment:                        - Prior to the procedure, a History and Physical was                         performed, and patient medications, allergies and                         sensitivities were reviewed. The patient's tolerance                         of previous anesthesia was reviewed.                        - The risks and benefits of the procedure and the                         sedation options and risks were discussed with the                         patient. All questions were answered and informed                         consent was obtained.                        After obtaining informed consent, the colonoscope was                         passed under direct vision. Throughout the procedure,                         the patient's blood pressure, pulse, and oxygen                         saturations were monitored continuously. The                         Colonoscope was introduced through the anus and                         advanced to the the cecum, identified by appendiceal                         orifice and  ileocecal valve. The colonoscopy was                         performed without difficulty. The patient tolerated                         the procedure well. The quality of the bowel                         preparation was excellent. Findings:      The  entire examined colon appeared normal on direct and retroflexion       views. Impression:            - The entire examined colon is normal on direct and                         retroflexion views.                        - No specimens collected. Recommendation:        - Repeat colonoscopy in 10 years for screening                         purposes. Procedure Code(s):     --- Professional ---                        [phone], Colonoscopy, flexible; diagnostic, including                         collection of specimen(s) by brushing or washing, when                         performed (separate procedure) Diagnosis Code(s):     --- Professional ---                        Z12.11, Encounter for screening for malignant neoplasm                         of colon CPT copyright 2019 American Medical Association. All rights reserved. The codes documented in this report are preliminary and upon coder review may  be revised to meet current compliance requirements. Earline Mayotte, MD 05/22/2021 7:52:47 AM This report has been signed electronically. Number of Addenda: 0 Note Initiated On: 05/22/2021 6:57 AM Scope Withdrawal Time: 0 hours 10 minutes 32 seconds  Total Procedure Duration: 0 hours 17 minutes 55 seconds  Estimated Blood Loss:  Estimated blood loss: none.      [facility]

## 2021-05-23 ENCOUNTER — Encounter: Payer: Self-pay | Admitting: General Surgery

## 2021-05-28 ENCOUNTER — Other Ambulatory Visit: Payer: Self-pay | Admitting: Physical Medicine & Rehabilitation

## 2021-05-28 DIAGNOSIS — M546 Pain in thoracic spine: Secondary | ICD-10-CM

## 2021-06-04 ENCOUNTER — Ambulatory Visit
Admission: RE | Admit: 2021-06-04 | Discharge: 2021-06-04 | Disposition: A | Discharge status: Home / Self Care | Payer: 59 | Source: Ambulatory Visit | Attending: Physical Medicine & Rehabilitation | Admitting: Physical Medicine & Rehabilitation

## 2021-06-04 ENCOUNTER — Other Ambulatory Visit: Payer: Self-pay

## 2021-06-04 DIAGNOSIS — M546 Pain in thoracic spine: Secondary | ICD-10-CM | POA: Diagnosis present

## 2021-08-12 IMAGING — MG MM DIGITAL SCREENING BILAT W/ TOMO AND CAD
8 series · 8 of 24 positions shown · non-contrast
Comparison: Previous exam(s).

ACR Breast Density Category a: The breast tissue is almost entirely
fatty.

CLINICAL DATA: Screening.

EXAM:
DIGITAL SCREENING BILATERAL MAMMOGRAM WITH TOMOSYNTHESIS AND CAD
TECHNIQUE: Bilateral screening digital craniocaudal and mediolateral oblique
mammograms were obtained. Bilateral screening digital breast
tomosynthesis was performed. The images were evaluated with
computer-aided detection.

[R MLO synth-2D]
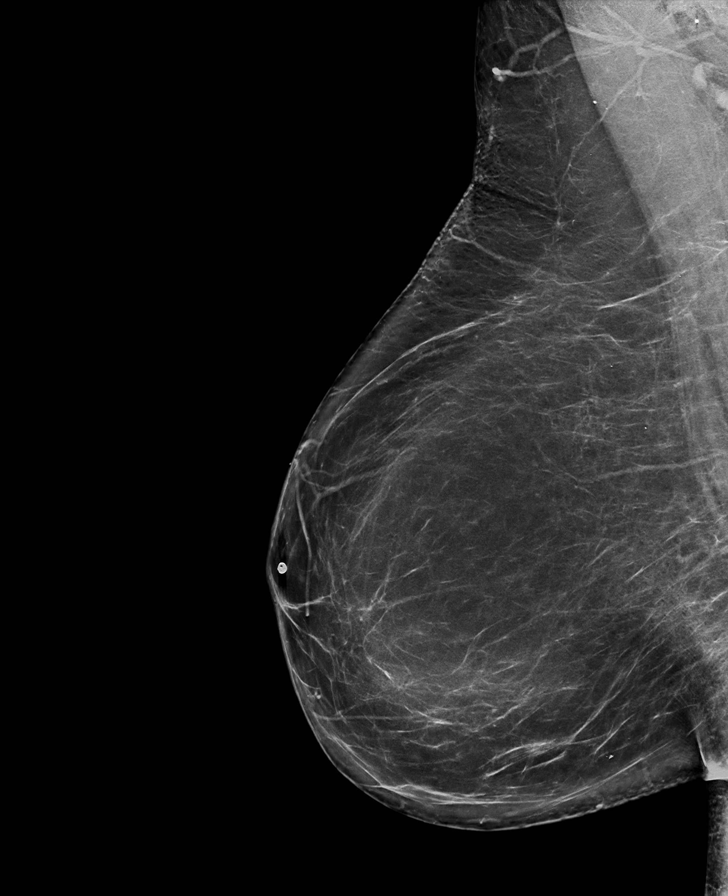

[L CC synth-2D]
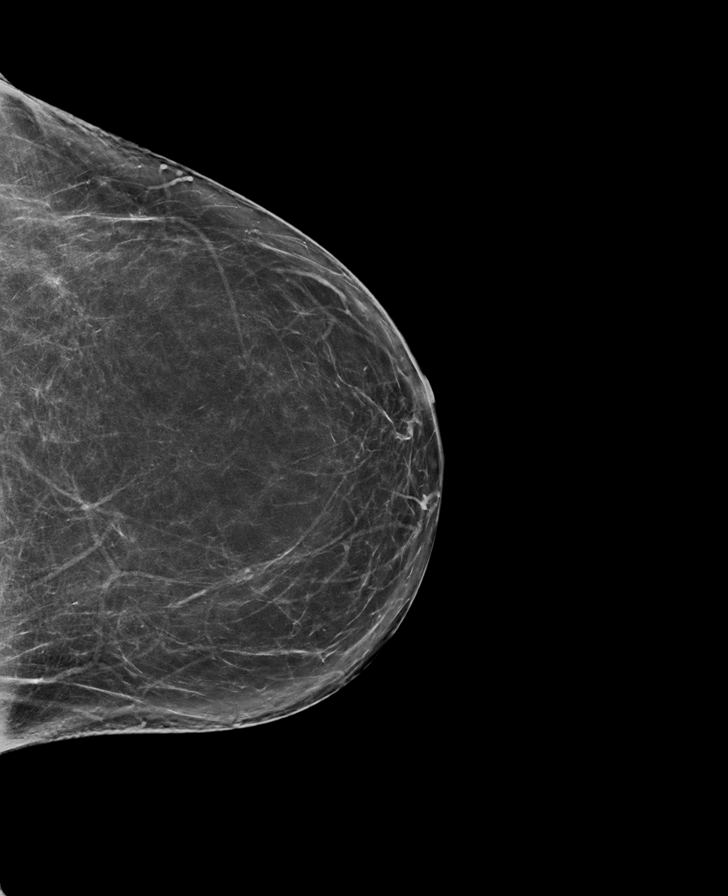

[R CC synth-2D]
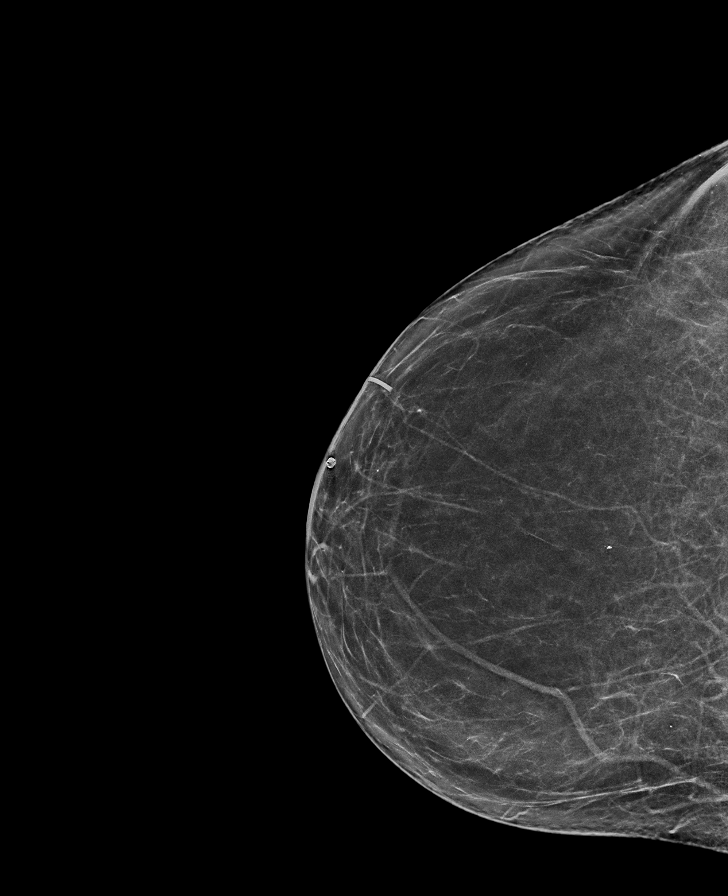

[L MLO synth-2D]
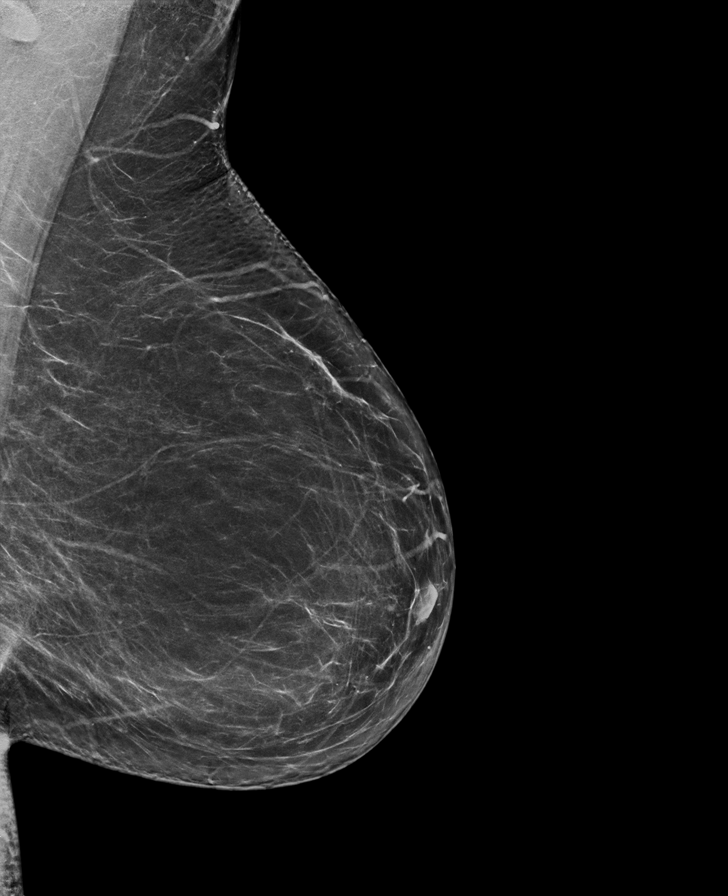

[L MLO tomo · tomo slice 39/76.0]
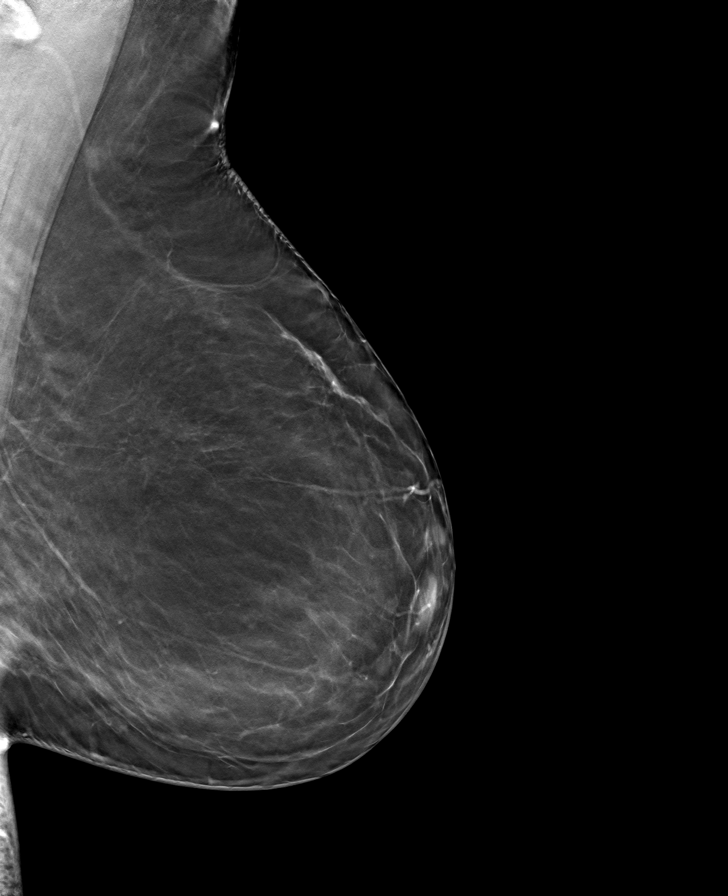

[R MLO tomo · tomo slice 41/81.0]
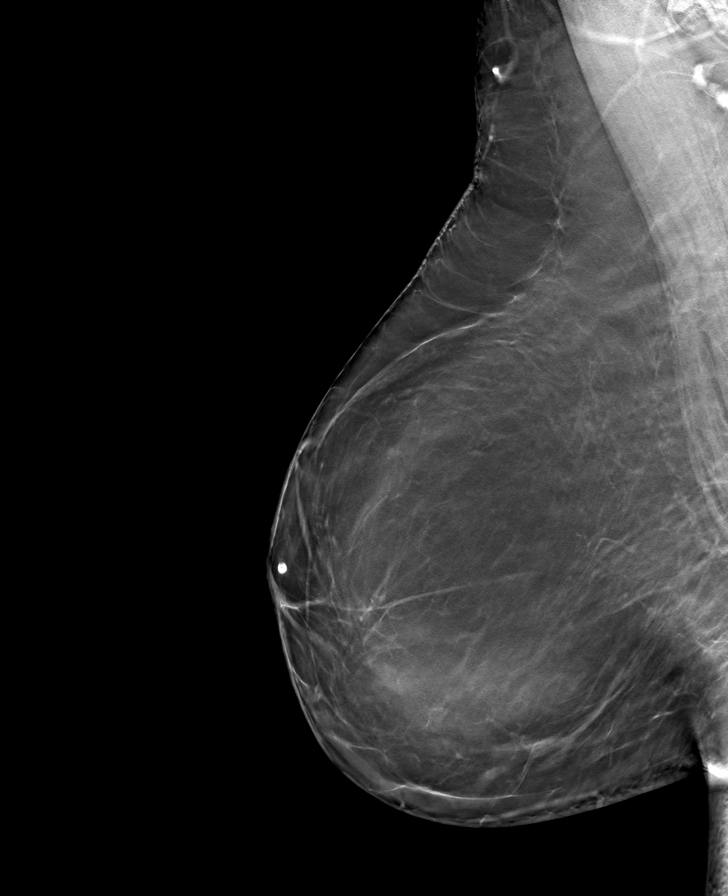

[R CC tomo · tomo slice 35/70.0]
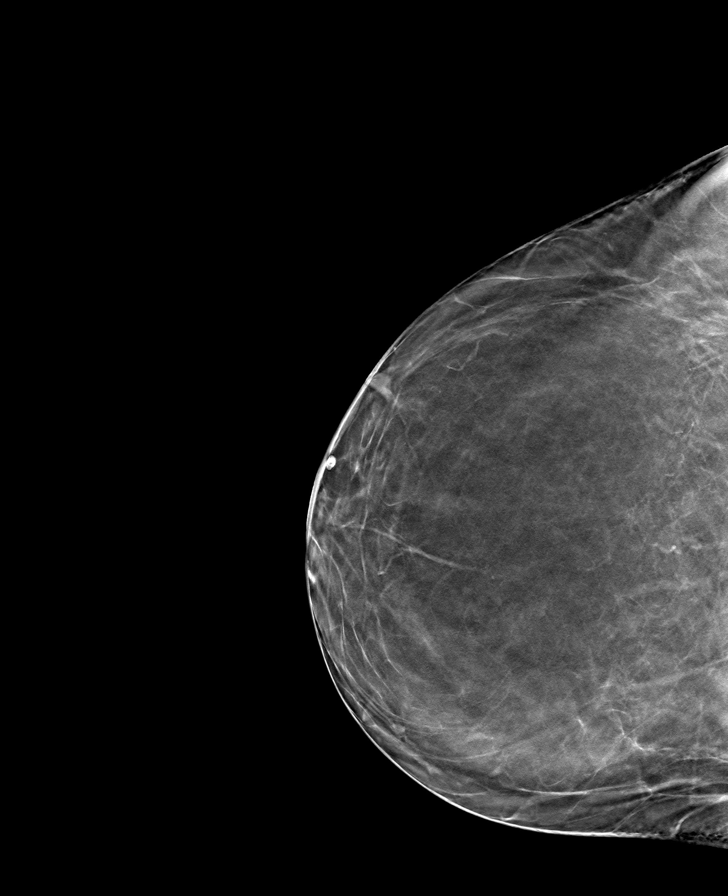

[L CC tomo · tomo slice 39/78.0]
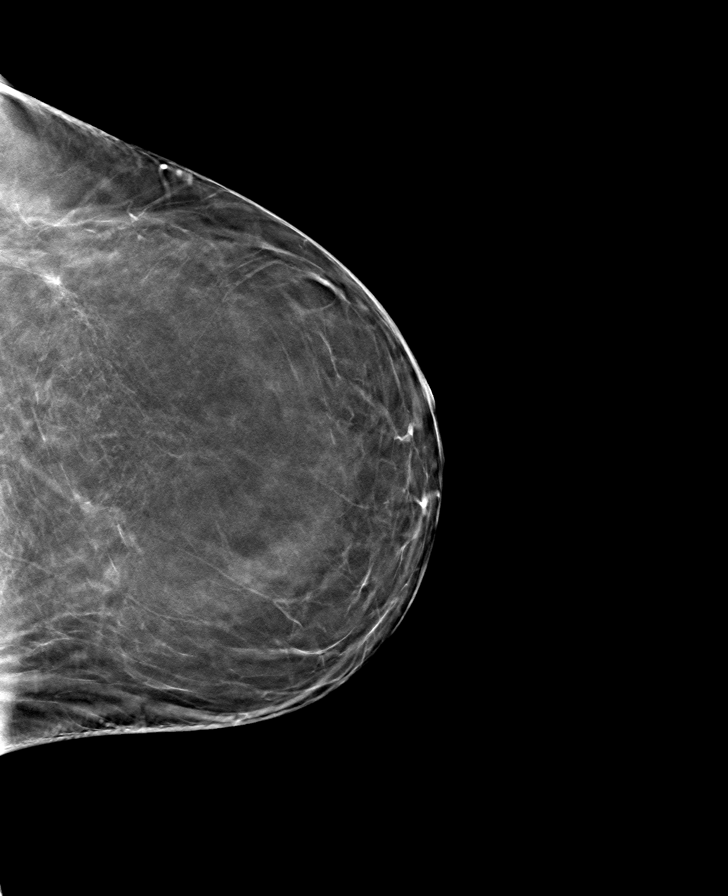

[8 of 24 positions shown; findings below may reference images not displayed]

FINDINGS: There are no findings suspicious for malignancy. The images were
evaluated with computer-aided detection.
IMPRESSION: No mammographic evidence of malignancy. A result letter of this
screening mammogram will be mailed directly to the patient.

RECOMMENDATION:
Screening mammogram in one year. (Code:JP-J-DD5)

BI-RADS CATEGORY  1: Negative.

## 2021-09-25 ENCOUNTER — Other Ambulatory Visit: Payer: Self-pay | Admitting: Neurology

## 2021-09-25 MED ORDER — CLONAZEPAM 0.5 MG PO TABS: 0.5000 mg | ORAL_TABLET | Freq: Two times a day (BID) | ORAL | 1 refills | Status: AC

## 2021-09-25 NOTE — Telephone Encounter (Signed)
Pt has moved requesting refill clonazePAM (KLONOPIN) 0.5 MG tablet for Pharmacy [facility] - Store [phone] [address] [place], [place] [address].

## 2021-11-06 ENCOUNTER — Ambulatory Visit: Payer: 59 | Admitting: Neurology
